# Patient Record
Sex: Female | Born: 1955 | Race: White | Hispanic: No | Marital: Married | Smoking: Former smoker
Health system: Southern US, Community
[De-identification: ages and names within clinical notes are randomized; demographics above are authoritative.]

## PROBLEM LIST (undated history)

## (undated) DIAGNOSIS — G8929 Other chronic pain: Secondary | ICD-10-CM

## (undated) DIAGNOSIS — M549 Dorsalgia, unspecified: Secondary | ICD-10-CM

## (undated) DIAGNOSIS — Q796 Ehlers-Danlos syndrome, unspecified: Secondary | ICD-10-CM

## (undated) HISTORY — PX: APPENDECTOMY: SHX54

## (undated) HISTORY — PX: MENISCUS REPAIR: SHX5179

## (undated) HISTORY — PX: JOINT REPLACEMENT: SHX530

## (undated) HISTORY — PX: NECK SURGERY: SHX720

## (undated) HISTORY — PX: SHOULDER SURGERY: SHX246

## (undated) HISTORY — PX: MASTECTOMY: SHX3

## (undated) HISTORY — PX: PARTIAL HYSTERECTOMY: SHX80

## (undated) HISTORY — PX: ANKLE SURGERY: SHX546

---

## 1999-03-09 ENCOUNTER — Emergency Department (HOSPITAL_COMMUNITY): Admission: EM | Admit: 1999-03-09 | Discharge: 1999-03-09 | Payer: Self-pay | Admitting: Emergency Medicine

## 1999-10-12 ENCOUNTER — Other Ambulatory Visit: Admission: RE | Admit: 1999-10-12 | Discharge: 1999-10-12 | Payer: Self-pay | Admitting: Internal Medicine

## 1999-10-25 ENCOUNTER — Encounter: Admission: RE | Admit: 1999-10-25 | Discharge: 1999-10-25 | Payer: Self-pay | Admitting: Internal Medicine

## 1999-10-25 ENCOUNTER — Encounter: Payer: Self-pay | Admitting: Internal Medicine

## 1999-10-31 ENCOUNTER — Encounter: Payer: Self-pay | Admitting: Internal Medicine

## 1999-10-31 ENCOUNTER — Encounter: Admission: RE | Admit: 1999-10-31 | Discharge: 1999-10-31 | Payer: Self-pay | Admitting: Internal Medicine

## 1999-11-09 ENCOUNTER — Encounter: Payer: Self-pay | Admitting: General Surgery

## 1999-11-09 ENCOUNTER — Encounter (INDEPENDENT_AMBULATORY_CARE_PROVIDER_SITE_OTHER): Payer: Self-pay | Admitting: Specialist

## 1999-11-09 ENCOUNTER — Ambulatory Visit (HOSPITAL_COMMUNITY): Admission: RE | Admit: 1999-11-09 | Discharge: 1999-11-09 | Payer: Self-pay | Admitting: General Surgery

## 2000-06-06 ENCOUNTER — Encounter: Admission: RE | Admit: 2000-06-06 | Discharge: 2000-06-06 | Payer: Self-pay | Admitting: Internal Medicine

## 2000-06-25 ENCOUNTER — Encounter: Admission: RE | Admit: 2000-06-25 | Discharge: 2000-06-25 | Payer: Self-pay | Admitting: Internal Medicine

## 2000-08-30 ENCOUNTER — Emergency Department (HOSPITAL_COMMUNITY): Admission: EM | Admit: 2000-08-30 | Discharge: 2000-08-30 | Payer: Self-pay | Admitting: *Deleted

## 2000-08-30 ENCOUNTER — Encounter: Payer: Self-pay | Admitting: *Deleted

## 2000-08-31 ENCOUNTER — Encounter: Payer: Self-pay | Admitting: Emergency Medicine

## 2000-10-03 ENCOUNTER — Encounter: Admission: RE | Admit: 2000-10-03 | Discharge: 2000-11-26 | Payer: Self-pay | Admitting: Surgical Oncology

## 2000-12-03 ENCOUNTER — Encounter: Payer: Self-pay | Admitting: Internal Medicine

## 2000-12-03 ENCOUNTER — Encounter: Admission: RE | Admit: 2000-12-03 | Discharge: 2000-12-03 | Payer: Self-pay | Admitting: Internal Medicine

## 2001-04-22 ENCOUNTER — Encounter: Payer: Self-pay | Admitting: Internal Medicine

## 2001-04-22 ENCOUNTER — Encounter: Admission: RE | Admit: 2001-04-22 | Discharge: 2001-04-22 | Payer: Self-pay | Admitting: Internal Medicine

## 2001-05-22 ENCOUNTER — Encounter: Admission: RE | Admit: 2001-05-22 | Discharge: 2001-05-22 | Payer: Self-pay | Admitting: Family Medicine

## 2001-05-22 ENCOUNTER — Encounter: Payer: Self-pay | Admitting: Family Medicine

## 2001-05-30 ENCOUNTER — Ambulatory Visit (HOSPITAL_COMMUNITY): Admission: RE | Admit: 2001-05-30 | Discharge: 2001-05-30 | Payer: Self-pay | Admitting: Neurological Surgery

## 2001-05-30 ENCOUNTER — Encounter: Payer: Self-pay | Admitting: Neurological Surgery

## 2001-06-10 ENCOUNTER — Encounter: Payer: Self-pay | Admitting: Family Medicine

## 2001-06-10 ENCOUNTER — Ambulatory Visit (HOSPITAL_COMMUNITY): Admission: RE | Admit: 2001-06-10 | Discharge: 2001-06-10 | Payer: Self-pay | Admitting: Family Medicine

## 2001-07-09 ENCOUNTER — Inpatient Hospital Stay (HOSPITAL_COMMUNITY): Admission: RE | Admit: 2001-07-09 | Discharge: 2001-07-14 | Payer: Self-pay | Admitting: Orthopedic Surgery

## 2001-07-10 ENCOUNTER — Encounter: Payer: Self-pay | Admitting: Orthopedic Surgery

## 2001-07-11 ENCOUNTER — Encounter: Payer: Self-pay | Admitting: Orthopedic Surgery

## 2001-08-03 ENCOUNTER — Emergency Department (HOSPITAL_COMMUNITY): Admission: EM | Admit: 2001-08-03 | Discharge: 2001-08-03 | Payer: Self-pay | Admitting: *Deleted

## 2002-01-13 ENCOUNTER — Encounter: Payer: Self-pay | Admitting: Neurological Surgery

## 2002-01-13 ENCOUNTER — Encounter: Admission: RE | Admit: 2002-01-13 | Discharge: 2002-01-13 | Payer: Self-pay | Admitting: Neurological Surgery

## 2002-02-19 ENCOUNTER — Encounter: Admission: RE | Admit: 2002-02-19 | Discharge: 2002-02-19 | Payer: Self-pay | Admitting: *Deleted

## 2002-02-19 ENCOUNTER — Encounter: Payer: Self-pay | Admitting: *Deleted

## 2002-06-24 ENCOUNTER — Encounter: Payer: Self-pay | Admitting: Orthopedic Surgery

## 2002-06-24 ENCOUNTER — Encounter: Admission: RE | Admit: 2002-06-24 | Discharge: 2002-06-24 | Payer: Self-pay | Admitting: Orthopedic Surgery

## 2002-08-03 ENCOUNTER — Encounter: Payer: Self-pay | Admitting: Pulmonary Disease

## 2002-08-03 ENCOUNTER — Ambulatory Visit (HOSPITAL_COMMUNITY): Admission: RE | Admit: 2002-08-03 | Discharge: 2002-08-03 | Payer: Self-pay | Admitting: Internal Medicine

## 2002-10-23 ENCOUNTER — Encounter: Payer: Self-pay | Admitting: Pulmonary Disease

## 2002-11-20 ENCOUNTER — Other Ambulatory Visit: Admission: RE | Admit: 2002-11-20 | Discharge: 2002-11-20 | Payer: Self-pay | Admitting: Internal Medicine

## 2002-12-07 ENCOUNTER — Encounter: Payer: Self-pay | Admitting: Pulmonary Disease

## 2003-01-08 ENCOUNTER — Emergency Department (HOSPITAL_COMMUNITY): Admission: EM | Admit: 2003-01-08 | Discharge: 2003-01-08 | Payer: Self-pay | Admitting: Emergency Medicine

## 2003-07-07 ENCOUNTER — Ambulatory Visit (HOSPITAL_COMMUNITY): Admission: RE | Admit: 2003-07-07 | Discharge: 2003-07-07 | Payer: Self-pay | Admitting: Gastroenterology

## 2003-07-07 ENCOUNTER — Encounter (INDEPENDENT_AMBULATORY_CARE_PROVIDER_SITE_OTHER): Payer: Self-pay | Admitting: Specialist

## 2003-07-16 ENCOUNTER — Encounter: Admission: RE | Admit: 2003-07-16 | Discharge: 2003-07-16 | Payer: Self-pay | Admitting: Internal Medicine

## 2004-06-21 ENCOUNTER — Emergency Department (HOSPITAL_COMMUNITY): Admission: EM | Admit: 2004-06-21 | Discharge: 2004-06-21 | Payer: Self-pay | Admitting: *Deleted

## 2004-11-10 ENCOUNTER — Ambulatory Visit: Payer: Self-pay | Admitting: Internal Medicine

## 2005-05-04 ENCOUNTER — Emergency Department (HOSPITAL_COMMUNITY): Admission: EM | Admit: 2005-05-04 | Discharge: 2005-05-04 | Payer: Self-pay | Admitting: Emergency Medicine

## 2005-05-31 ENCOUNTER — Other Ambulatory Visit: Admission: RE | Admit: 2005-05-31 | Discharge: 2005-05-31 | Payer: Self-pay | Admitting: Internal Medicine

## 2005-06-05 ENCOUNTER — Ambulatory Visit: Payer: Self-pay | Admitting: Internal Medicine

## 2005-11-11 ENCOUNTER — Encounter: Admission: RE | Admit: 2005-11-11 | Discharge: 2005-11-11 | Payer: Self-pay | Admitting: Anesthesiology

## 2008-04-07 ENCOUNTER — Inpatient Hospital Stay (HOSPITAL_COMMUNITY): Admission: RE | Admit: 2008-04-07 | Discharge: 2008-04-16 | Payer: Self-pay | Admitting: Orthopedic Surgery

## 2008-04-20 ENCOUNTER — Emergency Department (HOSPITAL_COMMUNITY): Admission: EM | Admit: 2008-04-20 | Discharge: 2008-04-21 | Payer: Self-pay | Admitting: Emergency Medicine

## 2009-01-11 ENCOUNTER — Ambulatory Visit: Payer: Self-pay | Admitting: Pulmonary Disease

## 2009-01-11 DIAGNOSIS — R0602 Shortness of breath: Secondary | ICD-10-CM | POA: Insufficient documentation

## 2009-01-11 DIAGNOSIS — E785 Hyperlipidemia, unspecified: Secondary | ICD-10-CM | POA: Insufficient documentation

## 2009-01-11 DIAGNOSIS — Q796 Ehlers-Danlos syndrome, unspecified: Secondary | ICD-10-CM | POA: Insufficient documentation

## 2009-01-11 DIAGNOSIS — J309 Allergic rhinitis, unspecified: Secondary | ICD-10-CM | POA: Insufficient documentation

## 2009-01-11 DIAGNOSIS — E782 Mixed hyperlipidemia: Secondary | ICD-10-CM | POA: Insufficient documentation

## 2009-01-11 DIAGNOSIS — F952 Tourette's disorder: Secondary | ICD-10-CM | POA: Insufficient documentation

## 2010-09-23 ENCOUNTER — Encounter: Payer: Self-pay | Admitting: Gastroenterology

## 2010-09-24 ENCOUNTER — Encounter: Payer: Self-pay | Admitting: *Deleted

## 2010-10-03 NOTE — Progress Notes (Signed)
Summary: Pulmonary [facility]   Imported By: Sherian Rein 01/11/2009 15:18:07  _____________________________________________________________________  External Attachment:    Type:   Image     Comment:   External Document

## 2010-10-03 NOTE — Assessment & Plan Note (Signed)
Summary: consult for dyspnea   Copy to:  Roselind Messier Primary Arrianna Catala/Referring Shereen Marton:  Roselind Messier  CC:  Pulmonary Consult.  History of Present Illness: The pt is a 55y/o female in who is referred for evaluation of dyspnea.  She has been seen in the past for a similiar complaint, and felt to have VCD.  Her pfts at the time were normal, and she was treated with a PPI bid for presumed LPR.  She did have significant improvement with this per Dr. Thurston Hole note.  The pt notes intermittant sob at rest that resolves with "raising my arms over my head".  She has doe as well, but is very debilitated from recent surgeries.  She has a dry cough intermittantly, but had one episode where she "coughed up a ball of yellow stuff".  Her recent cxr's have been totally clear.  She denies pleuritic chest pain, hemoptysis, LE edema, and calf tenderness.  Her sats are 97% on room air today.  She does note postnasal drip, and only has clear drainage from nares.  She denies reflux symptoms, and takes protonix daily.  Preventive Screening-Counseling & Management     Smoking Status: quit     Packs/Day: 1.0     Year Started: 1970     Year Quit: 2000  Current Medications (verified): 1)  Trazodone Hcl 150 Mg Tabs (Trazodone Hcl) .... Take 1 Tab By Mouth At Bedtime 2)  Dexedrine 10 Mg Xr24h-Cap (Dextroamphetamine Sulfate) .... Take 1 Tablet By Mouth Once A Day 3)  Lexapro 20 Mg Tabs (Escitalopram Oxalate) .... Take 1 Tablet By Mouth Once A Day 4)  Oxycontin 10 Mg Xr12h-Tab (Oxycodone Hcl) .... Take 1 Tablet By Mouth Two Times A Day 5)  Percocet .... As Needed For Breakthrough Pain 6)  Metanx 2.8-25-2 Mg Tabs (L-Methylfolate-B6-B12) .... Take 1 Tab By Mouth At Bedtime 7)  Klonopin 0.5 Mg Tabs (Clonazepam) .... Take 1 Tab By Mouth At Bedtime 8)  Lipitor 10 Mg Tabs (Atorvastatin Calcium) .... Take 1 Tablet By Mouth Once A Day  Allergies (verified): 1)  ! Sulfa 2)  ! * Nickle 3)  ! * Cobalt 4)  ! 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  Family History:    emphysema: paternal grandmother    heart disease: mother    clotting disorders: father (stroke)   Social History:    Patient states former smoker.  started at age 62.  1 ppd.  quit 2000.    pt is married with 2 children.    pt is not currently working.  Previously worked as a Museum/gallery conservator.      Smoking Status:  quit    Packs/Day:  1.0  Review of Systems       The patient complains of shortness of breath with activity, shortness of breath at rest, non-productive cough, loss of appetite, weight change, headaches, anxiety, depression, joint stiffness or pain, and change in color of mucus.  The patient denies productive cough, coughing up blood, chest pain, irregular heartbeats, acid heartburn, indigestion, abdominal pain, difficulty swallowing, sore throat, tooth/dental problems, nasal congestion/difficulty breathing through nose, sneezing, itching, ear ache, hand/feet swelling, rash, and fever.    Vital Signs:  Patient profile:   55  year old female Height:      65 inches Weight:      190.13 pounds BMI:     31.75 O2 Sat:      97 % Temp:     99.8 degrees F oral Pulse rate:   123 / minute BP sitting:   130 / 88  (left arm) Cuff size:   regular  Vitals Entered By: Arman Filter LPN (Jan 11, 2009 11:28 AM)  O2 Sat on room air at rest %:  97 CC: Pulmonary Consult Is Patient Diabetic? No Comments Medications reviewed with patient Arman Filter LPN  Jan 11, 2009 11:43 AM    Physical Exam  General:  obese female in nad Eyes:  PERRLA and EOMI.   Nose:  patent without discharge Mouth:  clear Neck:  soft and nontender, bs+ Lungs:  totally clear to auscultation Heart:  rrr, no mrg Abdomen:  soft and  nontender,bs+ Extremities:  minimal edema, no calf tenderness. Neurologic:  alert and oriented, moves all 4.   Pulmonary Function Test Date: 01/11/2009 Gender: Female  Pre-Spirometry FVC    Value: 3.43 L/min   Pred: 3.24 L/min     % Pred: 106 % FEV1    Value: 2.80 L     Pred: 2.65 L     % Pred: 105 % FEV1/FVC  Value: 82 %     Evaluation: normal  Impression & Recommendations:  Problem # 1:  DYSPNEA (ICD-786.05) there is nothing by the patient's history, exam, or objective testing, to suggest a pulmonary etiology for any of her symptoms. 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 Given that her symptoms are similar, I think we should treat her more aggressively for reflux and see if things improve. I see no reason to do further pulmonary workup at this time. I've also asked her to work on weight loss and some type of conditioning program.  Medications Added to Medication List This Visit: 1)  Trazodone Hcl 150 Mg Tabs (Trazodone hcl) .... Take 1 tab by mouth at bedtime 2)  Dexedrine 10 Mg Xr24h-cap (Dextroamphetamine sulfate) .... Take 1 tablet by mouth once a day 3)  Lexapro 20 Mg Tabs (Escitalopram oxalate) .... Take 1 tablet by mouth once a day 4)  Oxycontin 10 Mg Xr12h-tab (Oxycodone hcl) .... Take 1 tablet by mouth two times a day 5)  Percocet  .... As needed for breakthrough pain 6)  Metanx 2.8-25-2 Mg Tabs (L-methylfolate-b6-b12) .... Take 1 tab by mouth at bedtime 7)  Klonopin 0.5 Mg Tabs (Clonazepam) .... Take 1 tab by mouth at bedtime 8)  Lipitor 10 Mg Tabs (Atorvastatin calcium) .... Take 1 tablet by mouth once a day 9)  Protonix 40 Mg Tbec (Pantoprazole sodium) .... One am and pm before meals  Other Orders: Consultation Level IV (04540) Spirometry w/Graph (98119)  Patient Instructions: 1)  stay on protonix and increase dose to am and pm for 4 weeks to see if symptoms improve. If  not, then go back to once a day.  If improves, you need to discuss maintenance dose with your primary md 2)  work on weight loss and conditioning. 3)  no further pulmonary follow up needed.  Prescriptions: PROTONIX 40 MG  TBEC (PANTOPRAZOLE SODIUM) one am and pm before meals  #60 x 0   Entered and Authorized by:   Barbaraann Share MD   Signed by:   Barbaraann Share  MD on 01/11/2009   Method used:   Print then Give to Patient   RxID:   [id number]

## 2010-10-03 NOTE — Progress Notes (Signed)
Summary: Pulmonary Office Note/[facility]  Pulmonary Office Note/[facility]   Imported By: Sherian Rein 01/11/2009 15:23:53  _____________________________________________________________________  External Attachment:    Type:   Image     Comment:   External Document

## 2011-01-16 NOTE — Op Note (Signed)
[name], [name]              ACCOUNT NO.:  [id number]   MEDICAL RECORD NO.:  [id number]          PATIENT TYPE:  INP   LOCATION:  5013                         FACILITY:  [facility]   PHYSICIAN:  Loreta Ave, M.D. DATE OF BIRTH:  04/30/1956   DATE OF PROCEDURE:  04/07/2008  DATE OF DISCHARGE:                               OPERATIVE REPORT   PREOPERATIVE DIAGNOSIS:  Right hip end-stage degenerative arthritis.   POSTOPERATIVE DIAGNOSIS:  Right hip end-stage degenerative arthritis.   PROCEDURE:  Right total hip replacement utilizing Stryker prosthesis.  Press-fit 50-mm acetabular component screw fixation x2.  A 32-mm  internal-diameter 10-degree overhang polyethylene liner.  A press-fit  Accolade #2 femoral component, 127-degree neck angle.  A 32-mm +0 Bio-  Lok head.   SURGEON:  Loreta Ave, MD   ASSISTANT:  Genene Churn. Denton Meek.  Presence throughout the entire case  was necessary for timely completion of the procedure.   ANESTHESIA:  General.   BLOOD LOSS:  500 mL.   BLOOD GIVEN:  None.   SPECIMENS:  None.   CULTURES:  None.   COMPLICATIONS:  None.   DRESSINGS:  Soft compressive with abduction pillow.   DESCRIPTION OF PROCEDURE:  The patient was brought to the operating room  and placed on the operating table in supine position.  After adequate  anesthesia had been obtained turned to a lateral position, right side  up.  Prepped and draped in the usual sterile fashion.  Incision along  the shaft of the femur extending posterosuperior.  Skin and subcutaneous  tissue divided.  The iliotibial band exposed, incised.  Charnley  retractor put in place.  External rotator and capsule taken down off the  back of the intertrochanteric groove of the femur.  Neurovascular  structures identified and protected throughout.  External rotator and  capsule tagged with FiberWire.  Hip dislocated posteriorly.  __________.  Femoral neck at one fingerbreadth above the lesser  trochanter.  Femoral  head removed.  Acetabulum exposed.  Soft tissue debrided, redundant  labrum all removed.  Debridement down to good bleeding bone __________.  The patient __________, sized for a 50 mm component.  This was hammered  in place, set at 45 degrees of abduction, 20 degrees of anteversion.  __________ augmented with two screws through the cup pre-drilled.  A 10-  degree overhang 32-mm internal-diameter polyethylene liner placed with  the overhang placed superoposterior.  Pleased with alignment and  fixation.  Attention turned to femur.  This was reamed out to a #2  component.  After appropriate trials, a #2 component was seated,  restoring normal anteversion.  With a 32 +0 Bio-Lok head attached, the  hip was reduced.  Excellent restoration of leg lengths.  Good stability  in flexion and extension.  Wound irrigated.  External rotator and  capsule were repaired to the back of the intertrochanteric groove and  FiberWire tied over the bony bridge.  Charnley retractor removed.  Wound  irrigated.  The iliotibial band closed with #1 Vicryl, skin and  subcutaneous tissue with Vicryl and staples.  Washed the wound, injected  Marcaine.  Returned to supine position.  Abduction pillow placed.  Anesthesia reversed.  Brought to Recovery Room.  Tolerated surgery well.  No complications.      Loreta Ave, M.D.  Electronically Signed     DFM/MEDQ  D:  04/07/2008  T:  04/08/2008  Job:  [id number]

## 2011-01-16 NOTE — Op Note (Signed)
NAMESKYLEEN, [name]              ACCOUNT NO.:  [id number]   MEDICAL RECORD NO.:  [id number]          PATIENT TYPE:  INP   LOCATION:  5013                         FACILITY:  [facility]   PHYSICIAN:  Loreta Ave, M.D. DATE OF BIRTH:  1955/12/29   DATE OF PROCEDURE:  DATE OF DISCHARGE:                               OPERATIVE REPORT   PREOPERATIVE DIAGNOSIS:  Status post right total hip replacement with  early shifting, loosening, and change of position of acetabular  component to an acceptable position.   POSTOPERATIVE DIAGNOSES:  Status post right total hip replacement with  early shifting, loosening, and change of position of acetabular  component to an acceptable position with also loosening of femoral  component very early on postop and an under appreciation of her degree  of regional osteopenia.  Also postoperative hematoma.   PROCEDURES:  Exploration of right hip with drainage of superficial and  deep hematoma.  Revision of all components with removal of femoral  component in its entirety.  Removal of acetabular polyethylene and  metallic components.  Revision to a slightly larger acetabular component  of 54 mm.  Screw fixation x2.  A 36-mm internal diameter 10-degree  overhang polyethylene insert.  Revision of femoral component to a Secur-  Fit Plus femoral component size #7 with 11 mm distal stem.  A Bio-Lock  36-mm ceramic head -2.5 mm.   SURGEON:  Loreta Ave, MD   ASSISTANT:  Genene Churn. Barry Dienes, [place], present throughout the entire case and  necessary for timely completion of the procedure.   ANESTHESIA:  General.   ESTIMATED BLOOD LOSS:  300 mL.   BLOOD GIVEN:  None.   SPECIMENS:  None.   CULTURES:  None.   COMPLICATIONS:  None.   DRESSING:  Soft compressive.   PROCEDURE:  The patient was brought to the operating room, placed on the  operating table in supine position.  After adequate anesthesia had been  obtained, turned to a lateral position.  The right  hip was examined.  Despite the vertical shifting of the acetabular component, her hips  still remained exceptionally stable.  Prepped and draped in usual  sterile fashion.  The previous staples removed.  Wound was opened.  A  moderate amount of superficial hematoma debrided.  No evidence of  infection.  Iliotibial band opened and deep hematoma cleared out as  well.  External rotator capsule taken back down off the back of the  intertrochanteric groove.  FiberWire removed and re-tagged with new  FiberWire.  The hip exposed.  Dislocated posteriorly, which was actually  fairly difficult to do.  I then attempted to remove the head from the  femoral component but in doing this, the entire femoral component came  out of the shaft.  This was despite the fact that it was securely  implanted and by x-ray appeared to be a good size prosthesis and  certainly did not appear to be undersized.  Her degree of regional  osteopenia was much more than I had appreciated at the time of her  initial intervention.  Given the fact that the  Accolade component was  that loose and that easily removed, I elected to revise this to a  different shape femoral component.  Attention was first turned to the  acetabulum.  The polyethylene insert was removed by placing a drill hole  and then a screw in this and the screw used to pop the polyethylene out.  There was obvious motion to an extent of the metallic portion and it had  shifted to a much more vertical position.  Able to remove the screws and  remove the component.  Again a little bit more osteopenia that I had  appreciated previously.  I brought the acetabulum up to bleeding bone  once again and removed all soft tissue debris.  I was finally able to  get good stability, seating, and fixation utilizing a 54-mm component.  I utilized two intraoperative x-rays to be satisfied that this fit well,  was well captured in good position and then two screws replaced through   the cup to get this firmly anchored.  I was pleased with stability and  alignment.  I utilized the 10-degree overhang 36-mm internal diameter  insert with the overhang placed posterosuperiorly.  Thorough irrigation  prior to placement of component.  I then prepared the femur with  proximal and distal reaming for the Secur-Fit Plus component.  This was  finally fixed and seated with a #7 component with 11-mm distal stem.  After appropriate trials, the -2.5 mm x 36 mm Bio-Lock head was attached  and the hip reduced.  Excellent equal leg lengths.  Excellent stability  in flexion and extension.  I then got a final x-ray just to be sure all  components were fixed in good position and there was nothing adverse.  Once this was confirmed, the external rotator and capsule repair of the  back in intertrochanteric groove through drill holes.  Sutures tied over  bony bridge.  Wound irrigated.  Iliotibial band closed with Vicryl.  Subcutaneous tissue closed with Vicryl.  Skin closed with nylon.  Because of issues of skin irritation from tape, we used a large clear  dressing over top of this avoiding tape.  Returned to supine position.  Anesthesia reversed.  Brought to recovery room.  Tolerated the surgery  well.  No complications.      Loreta Ave, M.D.  Electronically Signed     DFM/MEDQ  D:  04/15/2008  T:  04/15/2008  Job:  [id number]

## 2011-01-19 NOTE — Op Note (Signed)
   [name], [name]                        ACCOUNT NO.:  [id number]   MEDICAL RECORD NO.:  [id number]                   PATIENT TYPE:  AMB   LOCATION:  ENDO                                 FACILITY:  [facility]   PHYSICIAN:  John C. Madilyn Fireman, M.D.                 DATE OF BIRTH:  October 19, 1955   DATE OF PROCEDURE:  07/07/2003  DATE OF DISCHARGE:                                 OPERATIVE REPORT   PROCEDURE:  Esophagogastroduodenoscopy with biopsy.   INDICATION FOR PROCEDURE:  Patient with dyspepsia, early satiety, nausea,  and constipation who is also undergoing screening colonoscopy.   DESCRIPTION OF PROCEDURE:  The patient was placed in the left lateral  decubitus position and placed on the pulse monitor with continuous low-flow  oxygen delivered by nasal cannula.  She was sedated with 62.5 mcg IV  fentanyl and 5 mg IV Versed.  The Olympus video endoscope was advanced under  direct vision into the oropharynx and esophagus.  The esophagus was straight  and of normal caliber with the squamocolumnar line at 38 cm.  There was no  visible hiatal hernia, ring, stricture, or other abnormality of the GE  junction.  The stomach was entered, and a small amount of liquid secretions  were suctioned from the fundus.  Retroflexed view of the cardia was  unremarkable.  The fundus, body, antrum, and pylorus all appeared normal.  The duodenum was entered, and both the bulb and second portion were well-  inspected and appeared to be within normal limits.  The scope was then  withdrawn back into the stomach, and there were some streaks of erythema  noted, and a CLOtest was obtained.  The scope was then withdrawn and the  patient returned to the recovery room in stable condition.  She tolerated  the procedure well, and there were no immediate complications.   IMPRESSION:  1. Mild antral gastritis.  2. Otherwise normal sturdy.   PLAN:  Await CLOtest and proceed to colonoscopy as planned.                                     John C. Madilyn Fireman, M.D.    JCH/MEDQ  D:  07/07/2003  T:  07/07/2003  Job:  [id number]   cc:   Lilla Shook, M.D.  [address], [address]  [place]  [place]  [address]  Fax: [phone]   Charlaine Dalton. Sherene Sires, M.D. [facility]

## 2011-01-19 NOTE — Op Note (Signed)
   NAME:  [name], [name]                        ACCOUNT NO.:  [id number]   MEDICAL RECORD NO.:  [id number]                   PATIENT TYPE:  EMS   LOCATION:  MAJO                                 FACILITY:  [facility]   PHYSICIAN:  John C. Madilyn Fireman, M.D.                 DATE OF BIRTH:  11/26/1980   DATE OF PROCEDURE:  07/07/2003  DATE OF DISCHARGE:  05/29/2003                                 OPERATIVE REPORT   PROCEDURE:  Colonoscopy.   INDICATIONS FOR PROCEDURE:  Colon cancer screening in a 55 year old patient  with a personal history of breast cancer who is also undergoing an EGD today  for dyspeptic symptoms.   DESCRIPTION OF PROCEDURE:  The patient was placed in the left lateral  decubitus position and placed on the pulse monitor with continuous low-flow  oxygen delivered by nasal cannula.  She was sedated with 37.5 mcg IV  fentanyl and 5 mg IV Versed in addition to the medicine given for the  previous EGD.  The Olympus video colonoscope was inserted into the rectum  and advanced to the cecum, confirmed by transillumination of McBurney's  point and visualization of the ileocecal valve and appendiceal orifice.  Prep was excellent.  The cecum, ascending, transverse, descending, and  sigmoid colon all appeared normal with no masses, polyps, diverticula, or  other mucosal abnormalities.  In the rectum, at approximately 15 cm, there  was a small 6-7 mm polyp that was fulgurated by hot biopsy.  The remainder  of the rectum appeared normal down to the anus where there were some  slightly enlarged internal hemorrhoids seen.  The scope was the withdrawn.  The patient returned to the recovery room in stable condition.  She  tolerated the procedure well and there were no immediate complications.   IMPRESSION:  1. Small rectal polyp.  2. Small internal hemorrhoids.   PLAN:  Await histology to determine the method and interval for future colon  screening.             John C. Madilyn Fireman, M.D.    JCH/MEDQ  D:  07/07/2003  T:  07/07/2003  Job:  [id number]   cc:   Lilla Shook, M.D.  [address], [address]  [place]  [place]  [address]  Fax: [phone]   Charlaine Dalton. Sherene Sires, M.D. [facility]

## 2011-01-19 NOTE — Discharge Summary (Signed)
[name], [name]              ACCOUNT NO.:  [id number]   MEDICAL RECORD NO.:  [id number]          PATIENT TYPE:  INP   LOCATION:  5013                         FACILITY:  [facility]   PHYSICIAN:  Loreta Ave, M.D. DATE OF BIRTH:  [birth year]   DATE OF ADMISSION:  04/07/2008  DATE OF DISCHARGE:  04/16/2008                               DISCHARGE SUMMARY   FINAL DIAGNOSES:  1. 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  2. Postoperative blood loss anemia.  3. Ehlers-Danlos syndrome.  4. Asthma.  5. Depression.  6. Hyperlipidemia.  7. Chronic pain management.   HISTORY OF PRESENT ILLNESS:  A 55 year old white female with history of  end-stage DJD, right hip and chronic pain presented to our office for  preop evaluation for a total hip replacement.  She had progressively  worsening pain with failed response to conservative treatment.  Significant decrease in her daily activities due to the ongoing  complaint.   HOSPITAL COURSE:  On April 07, 2008, the patient was taken to the [facility] OR and a right total hip replacement procedure performed.  Surgeon,  Loreta Ave, MD and assistant Genene Churn. Owens, PA-C; anesthesia,  general; EBL 500 mL; no drain used.  There were no surgical or  anesthesia complications, and the patient was transferred to recovery in  stable condition.  On April 08, 2008, the patient was doing well with  good pain control.  Temp 99.8, pulse 91, respirations 19, and blood  pressure 107/56.  Hematocrit 24.2, hemoglobin 8.1, and preop hemoglobin  was 14.7.  INR 1.2.  Dressing clean, dry, and intact.  Calf nontender.  Neurovascular intact.  Transfused 2 units of packed red blood cells due  to acute blood loss anemia.  Apparently that morning, Dr. Eulah Pont had  reviewed postop right hip x-ray, which showed that the acetabular  component had migrated to 70 degrees vertical position.  He spoke with  [name] and advised that this is not acceptable position for a good  long-term outcome for surgery; advised that best option at this point  would be taking her back to the OR for a total hip revision.  Procedure  discussed with the patient and her husband, and this to be planned for  April 12, 2008.  Stopped Coumadin and heparin protocol.  Started  Lovenox 40 mg 1 subcu injection daily.  On April 09, 2008, the patient  doing well with good pain control.  No complaints.  Temp 99.1, pulse 88,  respirations 22, and blood pressure 96/50.  Hemoglobin 9.4 and  hematocrit 27.6.  Sodium 139, potassium 4.3, chloride 106, CO2 27, BUN  5, creatinine 0.64, glucose 119, and INR 3.1.  Wound looks good.  Staples intact.  No drainage or signs of infection.  Calf nontender.  Neurovascular intact.  Discontinued PCA.  On April 10, 2008, the patient  doing well.  She has some concern about blister formation from  the  bandage.  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  She said she is  allergic to multiple adhesives.  Temp 99.7, pulse 79, respirations 18,  and blood pressure 123/57.  Hemoglobin 9.0 and hematocrit 26.9.  INR  2.4.  Wound looks good.  Staples intact.  There is minimal serous  drainage.  No signs of infection.  There are few small blisters around  the dressing site.  On April 11, 2008, the patient doing well.  No new  complaints.  Pain controlled.  Temp 100.4, pulse 76, respirations 18,  and blood pressure 97/63.  Hemoglobin 9.0 and hematocrit 26.8.  Electrolytes stable.  Wound looks good and no change in blisters.  Minimal serous drainage.  No signs of infection.  Calf nontender.  Neurovascularly intact.  Hold Lovenox dose this evening and plan for OR  in the a.m.  On April 12, 2008, the patient doing well with good pain  control.  She is requesting a copy of operative note for her records.  Temp 97.4, pulse 70,  respirations 20, and blood pressure 103/63.  Hemoglobin 9.3 and hematocrit 28.1.  INR 1.4.  Wound looks okay.  Staples intact.  She had moderate serous drainage on dressing.  No signs  of infection.  Neurovascularly intact.  Calf nontender.  At 1645 hours,  April 12, 2008, the patient was taken to the [facility] OR, and a right  total hip revision procedure performed, surgeon Loreta Ave, M.D.  and assistant Genene Churn. Owens, PA-C; anesthesia general; EBL 300 mL; no  drain used.  There were no surgical or anesthesia complications, and the  patient was transferred to recovery room in stable condition.  On April 13, 2008, the patient complained of right hip and right knee pain.  Previous history of chronic pain management and followed by Dr.  Vear Clock.  She requested that he see her in the hospital and she stated  that she had already called his office requesting a consult.  Right knee  pain since surgery yesterday.  Temp 100.6, pulse 83, respirations 22,  and blood pressure 99/62.  WBC 12.7, hematocrit 31.2, hemoglobin 10.7,  and platelets 277.  Sodium 133, potassium 4.0, chloride 100, CO2 28, BUN  6, creatinine 0.59, and glucose 151.  INR 1.5.  Dressing clean, dry, and  intact.  Right knee diffusely tender.  Calf nontender.  Neurovascularly  intact.  I had discussed with the patient typical protocol for postop  pain management after hip replacement/revision.  She did become very  upset and stated that she did not want me to see her in the hospital  during her admission.  I advised her that Dr. Vear Clock could be  consulted.  Dr. Eulah Pont advised of the situation.  Nurse from the unit  was present during the entire conversation with the patient.  We ordered  an x-ray, right knee.  Pharmacy-protocol Coumadin was started.  On  April 14, 2008, the patient awake and alert.  Comfortable.  Wound  looked good.  Sutures intact.  Discontinued Foley.  Weaned p.o.  medications.  The patient seen  by Dr. Eulah Pont.  Right knee x-ray, no  acute or significant findings.  On April 15, 2008, the patient was  doing well without complaints.  Pain under control with PCA.  Vital  signs stable, afebrile.  Hemoglobin 8.6 and hematocrit 25.6.  INR 2.5.  Wound looked good.  Staples intact.  No signs of infection.  Calf  nontender.  Neurovascularly  intact.  Changed medications to OxyContin 20  mg p.o. b.i.d., Percocet 10/325, 1-2 tabs p.o. q.4-6 h. p.r.n. for pain,  and Robaxin 500 mg 1 tab p.o. q.6 h. p.r.n. for spasms.  She is  progressing well with therapy.  On April 16, 2008, the patient doing  well, says that she is ready to go home.  Vital signs stable, afebrile.  INR 1.9.  Wound looks good.  There is a small amount of serous drainage.  No signs of infection.  Calf nontender.  Neurovascularly intact.   DISPOSITION:  Discharged to home.   CONDITION:  Stable.   MEDICATIONS:  1. Percocet 10/325, 1-2 tabs p.o. q.4-6 h. for pain.  2. Coumadin, pharmacy protocol.  3. Robaxin 500 mg 1 tab p.o. q.6 h. p.r.n. for spasms.  4. Resume previous home medications.   INSTRUCTIONS:  She will work with home health, PT, and OT to improve  ambulation.  She is at touchdown weightbearing.  Daily dressing changes  with 4 x 4s and tape of her choice.  Coumadin x4 weeks postop for DVT  prophylaxis.  She is okay to shower but no tub soaking.  Follow up when  she is 2 weeks postoperative for recheck.  Return sooner if needed.     Genene Churn. Denton Meek.      Loreta Ave, M.D.  Electronically Signed   [id number]/[id number]  D:  06/23/2008  T:  06/24/2008  Job:  [id number]

## 2011-01-19 NOTE — Op Note (Signed)
[place]  Patient:    [name], [name]                     MRN: [id number] Proc. Date: 11/09/99 Adm. Date:  [id number] Attending:  Tempie Donning CC:         Modesta Messing, M.D.                           Operative Report  PREOPERATIVE DIAGNOSIS:  Abnormal mammogram, right breast.  POSTOPERATIVE DIAGNOSIS:  Abnormal mammogram, right breast, pending pathology.  OPERATIVE PROCEDURE:  Excisional biopsy, right breast mass - abnormality on mammogram.  SURGEON:  Gita Kudo, M.D.  ANESTHESIA:  Intravenous sedation, 1% Xylocaine.  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  Recent mammogram showed new calcifications, and are suspicious enough to warrant biopsy.  OPERATIVE FINDINGS:  The area was well-localized by radiology.  The mammogram of the specimen showed that the calcifications had been removed.  DESCRIPTION OF PROCEDURE:  Under satisfactory intravenous sedation, the patients right breast was prepped and draped in a standard fashion.  One percent Xylocaine was infiltrated throughout for good analgesia.  A crescent shape incision was made around the wire, and then cautery and scalpel were used to remove a large portion of tissue widely around and deep to the wire.  I did not encounter the wire, and therefore, felt I had removed the calcifications.  The wound was made hemostatic by cautery and lavaged with saline.  The biopsy site was marked with several metal  clips and then the breast reconstructed with interrupted 3-0 Vicryl, and the skin edges approximated with 4-0 nylon.  After the report came back from x-ray, a sterile absorbent compressive dressing was applied, and the patient went to the recovery room from the operating room in good condition. DD:  11/09/99 TD:  11/10/99 Job: [id number]

## 2011-01-19 NOTE — Op Note (Signed)
[name]  Patient:    [name], [name] Visit Number: [id number] MRN: [id number]          Service Type: MED Location: [id number] Attending Physician:  Colbert Ewing Dictated by:   Loreta Ave, M.D. Proc. Date: 07/09/01 Admit Date:  07/09/2001                             Operative Report  PREOPERATIVE DIAGNOSIS:  Degenerative arthritis, left hip.  POSTOPERATIVE DIAGNOSIS:  Degenerative arthritis, left hip.  OPERATIVE PROCEDURE:  Left total hip replacement, [employer]on[employer] prosthesis, press fit 50 mm acetabular component, screw fixation x 2, 10 degree polyethylene insert, press fit femoral component size 8 with 12 mm distal stem Eon plus type, 0 head.  SURGEON;  Loreta Ave, M.D.  ASSISTANT:  Arlys John D. Petrarca, P.A.-C.  ANESTHESIA:  General.  ESTIMATED BLOOD LOSS:  250 cc.  BLOOD GIVEN:  None.  SPECIMENS EXCISED:  Bone and soft tissue.  CULTURES:  None.  COMPLICATIONS:  None.  DRESSING:  Soft compressive with abduction pillow.  DESCRIPTION OF PROCEDURE:  The patient was brought to the operating room and placed on the operating room table in supine position.  After adequate anesthesia had been obtained, turned to a lateral position left side up. Prepped and draped in the usual sterile fashion.  Lateral incision up the shaft and the femur extending posterior superior.  Skin and subcutaneous tissue divided.  The iliotibial band exposed.  Hemostasis with cautery.  The iliotibial band incised.  ______ retractor put in place.  Neurovascular structures identified and protected.  ______ and capsule taken down on the back of the intertrochanteric groove on the femur, and tagged with #1 Ethibond.  Leg lengths were assessed with mild shortening on the left.  The hip exposed posteriorly.  Hip dislocated posteriorly.  Femoral head had grade III approaching grade IV changes.  The femoral head was cut off in alignment with the  definitive prosthesis one fingerbreadth above the lesser trochanter. Acetabulum exposed.  She did have a degenerative tearing labrum which was all excised.  Some grade III changes in the acetabulum, very thin remaining articular cartilage.  Articular spurs, synovitis, and ligamentum all debrided from the acetabulum.  Sequential reaming up to good bleeding bone throughout. Sized for a 50 mm component.  This was able to be pressed put with excellent catching and excision.  Placed in 45 degrees of abduction and 20 of anteversion.  Fixation augmented with a 20 mm screw above, and a 16 mm screw posterior superior placed through the screw holes in the acetabulum, and firmly screwed down after pre-drilling.  Polyethylene was placed in the cup with a 10 degree overhand placed posterior superior.  Femur exposed. Sequential reaming with the hand held and the power reamers both proximally and distally up to good sizing throughout the femur proximal and distal from a #8 component with a 12 mm distal stem.  Trial reduction.  With a 127 degree neck angle and a 0 head, I had excellent restoration of leg lengths with good stability in flexion and extension and good motion.  Trials were removed.  The femur was copiously irrigated.  A #8 component was hammered down into place with excellent capturing fixation and restoration of normal femoral inversion met.  The 0 head attached.  Hip reduced and checked for stability and alignment, and was excellent.  Wound irrigated.  External rotators and  capsule repaired to the back of the intertrochanteric groove with the #1 sutures passed through drill holes and over tied over a blunted bridge.  Wound irrigated.  Neurovascular structures identified, intact, and protected. ______ retractor removed.  Iliotibial band closed with #1 Vicryl.  Skin and subcutaneous tissue with Vicryl and staples.  Margins of the wound injected with Marcaine.  Sterile compressive dressing  applied.  Returned to supine position.  Abduction pillow placed.  Anesthesia reversed.  Brought to the recovery room.  Tolerated surgery well with no complications. Dictated by:   Loreta Ave, M.D. Attending Physician:  Colbert Ewing DD:  07/09/01 TD:  07/11/01 Job: [id number]

## 2011-01-19 NOTE — Discharge Summary (Signed)
[place]. [facility]  Patient:    [name], PATANE Visit Number: [id number] MRN: [id number]          Service Type: EMS Location: [facility] Attending Physician:  Carmelina Peal Dictated by:   Oris Drone Petrarca, P.A.-C. Admit Date:  08/03/2001 Discharge Date: 08/03/2001                             Discharge Summary  ADMISSION DIAGNOSIS:  Degenerative joint disease of the left hip.  DISCHARGE DIAGNOSIS:  Degenerative joint disease of the left hip.  PROCEDURE:  Left total hip replacement.  HISTORY OF PRESENT ILLNESS:  This is a 55 year old white female with advancing DJD of the left hip.  She has had increasing pain in the left groin with difficulty with ambulation.  She has lost about 50% joint space in the last year and a half.  Now indicated for left total hip replacement.  HOSPITAL COURSE:  A 55 year old female admitted July 09, 2001.  After appropriate laboratory studies as well as 1 g of vancomycin IV call to the operating room, she was taken to the operating room where she underwent a left total hip replacement.  She tolerated the procedure well. She was continued postoperatively on vancomycin 1 g IV q.12h. x3 doses.  She was begun on heparin 5000 units subcu q.12h. until her Coumadin became therapeutic.  She was allowed out of bed the following day.  Physical therapy and occupational therapy consults were given.  Physical therapy for ambulation, touchdown weightbearing only on the left.  PCA pump was used for postop pain management. Her dressings were changed postop day #2.  She was then transferred over to 5000 when a bed was available.  Her temperature was 102.0 on postop day #2 and she was noted to be mildly hypokalemic. This was treated.  The remainder of her hospital course was uneventful and she was discharged on July 14, 2001, to return back to our office in 10 days for staple removal.  EKG was noted to have sinus bradycardia,  otherwise normal.  LABORATORY STUDIES:  July 08, 2001, hemoglobin 14.0, hematocrit 40.7%, white count 10,200, platelets 269,000.  Discharge  hemoglobin 8.8, hematocrit 25.6%.  Chemistries of July 08, 2001, showed a sodium of 136, potassium 4.0, chloride 102, CO2 26, glucose 98, BUN 13, creatinine 0.7, calcium 9.4, total protein 7.5, albumin 3.9, AST 20, ALT 24, ALP 69, total bilirubin 0.7. Discharge sodium 141, potassium 3.6, chloride 103, CO2 33, glucose 108, BUN 5, creatinine 0.7, calcium 8.6.  Urinalysis showed 15 ketones, urobilinogen 0.2, otherwise benign.  Blood type was O positive, antibody screen negative.  DISCHARGE INSTRUCTIONS:  She was given a prescription for iron sulfate 325 mg one p.o. daily. Coumadin 5 mg as directed by [facility].  Percocet 5/325 one to two q.4h. p.r.n. pain.  She will remain touchdown to nonweightbearing on the total hip side.  She will change her dressing as needed. She may shower on a daily basis.  No restrictions with her diet.  Call if she has swelling, redness, malodorous or temperature greater than 101 degrees. She will follow back up in the office in 10 days for staple removal.  She was discharge in improved condition. Dictated by:   Oris Drone Petrarca, P.A.-C. Attending Physician:  Carmelina Peal DD:  09/08/01 TD:  09/08/01 Job: [id number]

## 2011-01-19 NOTE — Op Note (Signed)
[facility]  Patient:    [name], [name]                     MRN: [id number] Proc. Date: 11/09/99 Adm. Date:  [id number] Disc. Date: [id number] Attending:  Tempie Donning CC:         Modesta Messing, M.D.                           Operative Report  PREOPERATIVE DIAGNOSIS:  Abnormal mammogram, right breast.  POSTOPERATIVE DIAGNOSIS:  Abnormal mammogram, right breast, pending pathology.  OPERATIVE PROCEDURE:  Excisional biopsy, right breast mass - abnormality on mammogram.  SURGEON:  Gita Kudo, M.D.  ANESTHESIA:  Intravenous sedation, 1% Xylocaine.  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  Recent mammogram showed new calcifications, and are suspicious enough to warrant biopsy.  OPERATIVE FINDINGS:  The area was well-localized by radiology.  The mammogram of the specimen showed that the calcifications had been removed.  DESCRIPTION OF PROCEDURE:  Under satisfactory intravenous sedation, the patients right breast was prepped and draped in a standard fashion.  One percent Xylocaine was infiltrated throughout for good analgesia.  A crescent shape incision was made around the wire, and then cautery and scalpel were used to remove a large portion of tissue widely around and deep to the wire.  I did not encounter the wire, and therefore, felt I had removed the calcifications.  The wound was made hemostatic by cautery and lavaged with saline.  The biopsy site was marked with several metal  clips and then the breast reconstructed with interrupted 3-0 Vicryl, and the skin edges approximated with 4-0 nylon.  After the report came back from x-ray, a sterile absorbent compressive dressing was applied, and the patient went to the recovery room from the operating room in good condition. DD:  11/09/99 TD:  11/10/99 Job: [id number]

## 2011-04-05 DIAGNOSIS — F411 Generalized anxiety disorder: Secondary | ICD-10-CM | POA: Insufficient documentation

## 2011-04-05 DIAGNOSIS — R030 Elevated blood-pressure reading, without diagnosis of hypertension: Secondary | ICD-10-CM | POA: Insufficient documentation

## 2011-04-05 DIAGNOSIS — Z86000 Personal history of in-situ neoplasm of breast: Secondary | ICD-10-CM | POA: Insufficient documentation

## 2011-06-01 LAB — CBC
HCT: 43.8
HCT: 24.2 — ABNORMAL LOW
HCT: 25.6 — ABNORMAL LOW
HCT: 26.8 — ABNORMAL LOW
HCT: 26.9 — ABNORMAL LOW
HCT: 27.6 — ABNORMAL LOW
HCT: 28.1 — ABNORMAL LOW
HCT: 31.2 — ABNORMAL LOW
HCT: 31.2 — ABNORMAL LOW
Hemoglobin: 14.7
Hemoglobin: 10.6 — ABNORMAL LOW
Hemoglobin: 10.7 — ABNORMAL LOW
Hemoglobin: 8.1 — ABNORMAL LOW
Hemoglobin: 8.6 — ABNORMAL LOW
Hemoglobin: 9 — ABNORMAL LOW
Hemoglobin: 9 — ABNORMAL LOW
Hemoglobin: 9.3 — ABNORMAL LOW
Hemoglobin: 9.4 — ABNORMAL LOW
MCHC: 33.6
MCHC: 33.2
MCHC: 33.4
MCHC: 33.6
MCHC: 33.7
MCHC: 33.7
MCHC: 33.8
MCHC: 34.1
MCHC: 34.3
MCV: 91
MCV: 89.6
MCV: 89.7
MCV: 89.7
MCV: 90.7
MCV: 90.7
MCV: 91.3
MCV: 91.4
MCV: 91.9
Platelets: 239
Platelets: 137 — ABNORMAL LOW
Platelets: 149 — ABNORMAL LOW
Platelets: 164
Platelets: 200
Platelets: 258
Platelets: 277
Platelets: 312
Platelets: 325
RBC: 4.81
RBC: 2.63 — ABNORMAL LOW
RBC: 2.8 — ABNORMAL LOW
RBC: 2.96 — ABNORMAL LOW
RBC: 2.99 — ABNORMAL LOW
RBC: 3.08 — ABNORMAL LOW
RBC: 3.1 — ABNORMAL LOW
RBC: 3.42 — ABNORMAL LOW
RBC: 3.48 — ABNORMAL LOW
RDW: 13.6
RDW: 13.3
RDW: 13.7
RDW: 13.8
RDW: 13.9
RDW: 13.9
RDW: 14
RDW: 14.1
RDW: 14.3
WBC: 9.6
WBC: 12.7 — ABNORMAL HIGH
WBC: 14.3 — ABNORMAL HIGH
WBC: 14.5 — ABNORMAL HIGH
WBC: 7.2
WBC: 8.5
WBC: 8.9
WBC: 9.2
WBC: 9.5

## 2011-06-01 LAB — URINALYSIS, ROUTINE W REFLEX MICROSCOPIC
Bilirubin Urine: NEGATIVE
Glucose, UA: NEGATIVE
Ketones, ur: NEGATIVE
Leukocytes, UA: NEGATIVE
Nitrite: NEGATIVE
Protein, ur: NEGATIVE
Specific Gravity, Urine: 1.028
Urobilinogen, UA: 0.2
pH: 5.5

## 2011-06-01 LAB — BASIC METABOLIC PANEL
BUN: 10
BUN: 5 — ABNORMAL LOW
BUN: 5 — ABNORMAL LOW
BUN: 5 — ABNORMAL LOW
BUN: 5 — ABNORMAL LOW
BUN: 6
BUN: 7
CO2: 27
CO2: 27
CO2: 27
CO2: 28
CO2: 30
CO2: 32
CO2: 34 — ABNORMAL HIGH
Calcium: 7.6 — ABNORMAL LOW
Calcium: 7.7 — ABNORMAL LOW
Calcium: 7.7 — ABNORMAL LOW
Calcium: 7.8 — ABNORMAL LOW
Calcium: 7.9 — ABNORMAL LOW
Calcium: 8.1 — ABNORMAL LOW
Calcium: 8.4
Chloride: 100
Chloride: 100
Chloride: 102
Chloride: 103
Chloride: 104
Chloride: 106
Chloride: 99
Creatinine, Ser: 0.59
Creatinine, Ser: 0.59
Creatinine, Ser: 0.64
Creatinine, Ser: 0.66
Creatinine, Ser: 0.67
Creatinine, Ser: 0.69
Creatinine, Ser: 0.76
GFR calc Af Amer: >60
GFR calc Af Amer: >60
GFR calc Af Amer: >60
GFR calc Af Amer: >60
GFR calc Af Amer: >60
GFR calc Af Amer: >60
GFR calc Af Amer: >60
GFR calc non Af Amer: >60
GFR calc non Af Amer: >60
GFR calc non Af Amer: >60
GFR calc non Af Amer: >60
GFR calc non Af Amer: >60
GFR calc non Af Amer: >60
GFR calc non Af Amer: >60
Glucose, Bld: 105 — ABNORMAL HIGH
Glucose, Bld: 107 — ABNORMAL HIGH
Glucose, Bld: 111 — ABNORMAL HIGH
Glucose, Bld: 119 — ABNORMAL HIGH
Glucose, Bld: 133 — ABNORMAL HIGH
Glucose, Bld: 136 — ABNORMAL HIGH
Glucose, Bld: 151 — ABNORMAL HIGH
Potassium: 3.6
Potassium: 3.9
Potassium: 4
Potassium: 4
Potassium: 4.3
Potassium: 4.4
Potassium: 4.5
Sodium: 133 — ABNORMAL LOW
Sodium: 133 — ABNORMAL LOW
Sodium: 136
Sodium: 138
Sodium: 139
Sodium: 140
Sodium: 142

## 2011-06-01 LAB — TYPE AND SCREEN
ABO/RH(D): O POS
ABO/RH(D): O POS
Antibody Screen: NEGATIVE
Antibody Screen: NEGATIVE

## 2011-06-01 LAB — PROTIME-INR
INR: 0.9
INR: 1.2
INR: 1.4
INR: 1.5
INR: 1.7 — ABNORMAL HIGH
INR: 1.9 — ABNORMAL HIGH
INR: 2.4 — ABNORMAL HIGH
INR: 2.5 — ABNORMAL HIGH
INR: 2.7 — ABNORMAL HIGH
INR: 3.1 — ABNORMAL HIGH
Prothrombin Time: 12.4
Prothrombin Time: 15.5 — ABNORMAL HIGH
Prothrombin Time: 18.2 — ABNORMAL HIGH
Prothrombin Time: 19.1 — ABNORMAL HIGH
Prothrombin Time: 21.2 — ABNORMAL HIGH
Prothrombin Time: 23.1 — ABNORMAL HIGH
Prothrombin Time: 27.9 — ABNORMAL HIGH
Prothrombin Time: 28.3 — ABNORMAL HIGH
Prothrombin Time: 30.9 — ABNORMAL HIGH
Prothrombin Time: 34.6 — ABNORMAL HIGH

## 2011-06-01 LAB — COMPREHENSIVE METABOLIC PANEL
ALT: 36 — ABNORMAL HIGH
ALT: 25
AST: 29
AST: 24
Albumin: 4.1
Albumin: 2.2 — ABNORMAL LOW
Alkaline Phosphatase: 76
Alkaline Phosphatase: 58
BUN: 18
BUN: 5 — ABNORMAL LOW
CO2: 25
CO2: 33 — ABNORMAL HIGH
Calcium: 9.4
Calcium: 8.2 — ABNORMAL LOW
Chloride: 106
Chloride: 103
Creatinine, Ser: 0.75
Creatinine, Ser: 0.69
GFR calc Af Amer: >60
GFR calc Af Amer: >60
GFR calc non Af Amer: >60
GFR calc non Af Amer: >60
Glucose, Bld: 111 — ABNORMAL HIGH
Glucose, Bld: 104 — ABNORMAL HIGH
Potassium: 4.3
Potassium: 4.6
Sodium: 138
Sodium: 142
Total Bilirubin: 0.5
Total Bilirubin: 0.5
Total Protein: 7.2
Total Protein: 4.8 — ABNORMAL LOW

## 2011-06-01 LAB — URINE MICROSCOPIC-ADD ON

## 2011-06-01 LAB — POCT I-STAT 4, (NA,K, GLUC, HGB,HCT)
Glucose, Bld: 109 — ABNORMAL HIGH
HCT: 29 — ABNORMAL LOW
Hemoglobin: 9.9 — ABNORMAL LOW
Potassium: 3.8
Sodium: 137

## 2011-06-01 LAB — PREPARE RBC (CROSSMATCH)

## 2011-06-01 LAB — APTT
aPTT: 34
aPTT: 65 — ABNORMAL HIGH

## 2011-06-01 LAB — ABO/RH: ABO/RH(D): O POS

## 2011-06-01 LAB — HEMOGLOBIN AND HEMATOCRIT, BLOOD
HCT: 34.9 — ABNORMAL LOW
Hemoglobin: 11.7 — ABNORMAL LOW

## 2012-01-13 DIAGNOSIS — F988 Other specified behavioral and emotional disorders with onset usually occurring in childhood and adolescence: Secondary | ICD-10-CM | POA: Insufficient documentation

## 2012-11-11 ENCOUNTER — Emergency Department (HOSPITAL_COMMUNITY)
Admission: EM | Admit: 2012-11-11 | Discharge: 2012-11-11 | Disposition: A | Discharge status: Home / Self Care | Payer: Medicare Other | Attending: Emergency Medicine | Admitting: Emergency Medicine

## 2012-11-11 ENCOUNTER — Encounter (HOSPITAL_COMMUNITY): Payer: Self-pay | Admitting: Emergency Medicine

## 2012-11-11 DIAGNOSIS — R5383 Other fatigue: Secondary | ICD-10-CM | POA: Insufficient documentation

## 2012-11-11 DIAGNOSIS — R1013 Epigastric pain: Secondary | ICD-10-CM | POA: Insufficient documentation

## 2012-11-11 DIAGNOSIS — G8929 Other chronic pain: Secondary | ICD-10-CM | POA: Insufficient documentation

## 2012-11-11 DIAGNOSIS — R197 Diarrhea, unspecified: Secondary | ICD-10-CM | POA: Insufficient documentation

## 2012-11-11 DIAGNOSIS — Z79899 Other long term (current) drug therapy: Secondary | ICD-10-CM | POA: Insufficient documentation

## 2012-11-11 DIAGNOSIS — Z8739 Personal history of other diseases of the musculoskeletal system and connective tissue: Secondary | ICD-10-CM | POA: Insufficient documentation

## 2012-11-11 DIAGNOSIS — R5381 Other malaise: Secondary | ICD-10-CM | POA: Insufficient documentation

## 2012-11-11 DIAGNOSIS — Z90710 Acquired absence of both cervix and uterus: Secondary | ICD-10-CM | POA: Insufficient documentation

## 2012-11-11 DIAGNOSIS — R112 Nausea with vomiting, unspecified: Secondary | ICD-10-CM | POA: Insufficient documentation

## 2012-11-11 HISTORY — DX: Ehlers-Danlos syndrome, unspecified: Q79.60

## 2012-11-11 HISTORY — DX: Dorsalgia, unspecified: M54.9

## 2012-11-11 HISTORY — DX: Other chronic pain: G89.29

## 2012-11-11 LAB — URINE MICROSCOPIC-ADD ON

## 2012-11-11 LAB — CBC WITH DIFFERENTIAL/PLATELET
Basophils Absolute: 0 10*3/uL (ref 0.0–0.1)
Basophils Relative: 0 % (ref 0–1)
Eosinophils Absolute: 0.1 10*3/uL (ref 0.0–0.7)
Eosinophils Relative: 1 % (ref 0–5)
HCT: 49.3 % — ABNORMAL HIGH (ref 36.0–46.0)
Hemoglobin: 16.9 g/dL — ABNORMAL HIGH (ref 12.0–15.0)
Lymphocytes Relative: 9 % — ABNORMAL LOW (ref 12–46)
Lymphs Abs: 1.4 10*3/uL (ref 0.7–4.0)
MCH: 29.8 pg (ref 26.0–34.0)
MCHC: 34.3 g/dL (ref 30.0–36.0)
MCV: 86.8 fL (ref 78.0–100.0)
Monocytes Absolute: 1.1 10*3/uL — ABNORMAL HIGH (ref 0.1–1.0)
Monocytes Relative: 8 % (ref 3–12)
Neutro Abs: 12.1 10*3/uL — ABNORMAL HIGH (ref 1.7–7.7)
Neutrophils Relative %: 82 % — ABNORMAL HIGH (ref 43–77)
Platelets: 289 10*3/uL (ref 150–400)
RBC: 5.68 MIL/uL — ABNORMAL HIGH (ref 3.87–5.11)
RDW: 13.9 % (ref 11.5–15.5)
WBC: 14.7 10*3/uL — ABNORMAL HIGH (ref 4.0–10.5)

## 2012-11-11 LAB — URINALYSIS, ROUTINE W REFLEX MICROSCOPIC
Bilirubin Urine: NEGATIVE
Glucose, UA: NEGATIVE mg/dL
Ketones, ur: NEGATIVE mg/dL
Nitrite: NEGATIVE
Protein, ur: NEGATIVE mg/dL
Specific Gravity, Urine: 1.018 (ref 1.005–1.030)
Urobilinogen, UA: 0.2 mg/dL (ref 0.0–1.0)
pH: 5.5 (ref 5.0–8.0)

## 2012-11-11 LAB — COMPREHENSIVE METABOLIC PANEL
ALT: 19 U/L (ref 0–35)
AST: 19 U/L (ref 0–37)
Albumin: 3.9 g/dL (ref 3.5–5.2)
Alkaline Phosphatase: 94 U/L (ref 39–117)
BUN: 16 mg/dL (ref 6–23)
CO2: 24 mEq/L (ref 19–32)
Calcium: 9.5 mg/dL (ref 8.4–10.5)
Chloride: 100 mEq/L (ref 96–112)
Creatinine, Ser: 0.76 mg/dL (ref 0.50–1.10)
GFR calc Af Amer: >90 mL/min (ref >90)
GFR calc non Af Amer: >90 mL/min (ref >90)
Glucose, Bld: 153 mg/dL — ABNORMAL HIGH (ref 70–99)
Potassium: 4.1 mEq/L (ref 3.5–5.1)
Sodium: 138 mEq/L (ref 135–145)
Total Bilirubin: 0.3 mg/dL (ref 0.3–1.2)
Total Protein: 8.6 g/dL — ABNORMAL HIGH (ref 6.0–8.3)

## 2012-11-11 LAB — LIPASE, BLOOD: Lipase: 16 U/L (ref 11–59)

## 2012-11-11 MED ORDER — SODIUM CHLORIDE 0.9 % IV BOLUS (SEPSIS)
1000.0000 mL | Freq: Once | INTRAVENOUS | Status: AC
  Administered 2012-11-11: 1000 mL via INTRAVENOUS

## 2012-11-11 MED ORDER — SODIUM CHLORIDE 0.9 % IV SOLN
Freq: Once | INTRAVENOUS | Status: AC
  Administered 2012-11-11: 16:00:00 via INTRAVENOUS

## 2012-11-11 MED ORDER — ONDANSETRON 8 MG PO TBDP: 8.0000 mg | ORAL_TABLET | Freq: Three times a day (TID) | ORAL | Status: DC | PRN

## 2012-11-11 NOTE — ED Notes (Signed)
Pt drinking ginger ale and states that it is staying down okay. Pt was able to ambulate to and from wheelchair to go to bathroom well. Did state that she had some diarrhea.

## 2012-11-11 NOTE — ED Notes (Signed)
Pt c/o NVD since about 0800 this morning.  Also c/o back pain which is chronic.  States she has been around someone who has had norovirus 2 days ago.

## 2012-11-11 NOTE — ED Notes (Signed)
Pt reports abd pain, back pain with n/v/d since 0800 this am.  Pt is also reporting chills.  Pt reports vomiting x 8-10 and diarrhea x 3 today.  Pt reports being around someone who was sick Sunday.  No vomiting or diarrhea at present.

## 2012-11-11 NOTE — ED Provider Notes (Signed)
History     CSN: [id number]  Arrival date & time 11/11/12  1257   First MD Initiated Contact with Patient 11/11/12 1432      Chief Complaint  Patient presents with  . Nausea  . Emesis  . Diarrhea    (Consider location/radiation/quality/duration/timing/severity/associated sxs/prior treatment) HPI Comments: Patient with history of Ehlers-Danlos syndrome, history of appendectomy and hysterectomy -- presents with a six-hour history of nausea, vomiting, and soft stools. Vomiting is nonbloody and nonbilious. Diarrhea is nonbloody. Upper abdominal cramping. Patient took her usual chronic pain medication. Patient states that she felt extremely weak today. H/o N/V/D several weeks ago that resolved in 2 days. The onset of this condition was gradual. The course is constant. Aggravating factors: none. Alleviating factors: none.    Patient is a 57 y.o. female presenting with vomiting and diarrhea. The history is provided by the patient.  Emesis Associated symptoms: abdominal pain (upper abd cramping) and diarrhea   Associated symptoms: no headaches, no myalgias and no sore throat   Diarrhea Associated symptoms: abdominal pain (upper abd cramping) and vomiting   Associated symptoms: no fever, no headaches and no myalgias     Past Medical History  Diagnosis Date  . Chronic back pain   . EDS (Ehlers-Danlos syndrome)     Past Surgical History  Procedure Laterality Date  . Mastectomy    . Neck surgery    . Joint replacement      2 shoulders, 4 hips    History reviewed. No pertinent family history.  History  Substance Use Topics  . Smoking status: Never Smoker   . Smokeless tobacco: Not on file  . Alcohol Use: No    OB History   Grav Para Term Preterm Abortions TAB SAB Ect Mult Living                  Review of Systems  Constitutional: Negative for fever.  HENT: Negative for sore throat and rhinorrhea.   Eyes: Negative for redness.  Respiratory: Negative for cough.    Cardiovascular: Negative for chest pain.  Gastrointestinal: Positive for nausea, vomiting, abdominal pain (upper abd cramping) and diarrhea.  Genitourinary: Negative for dysuria.  Musculoskeletal: Negative for myalgias.  Skin: Negative for rash.  Neurological: Negative for headaches.    Allergies  Pneumococcal vaccines and Sulfonamide derivatives  Home Medications   Current Outpatient Rx  Name  Route  Sig  Dispense  Refill  . Cholecalciferol (VITAMIN D) 2000 UNITS tablet   Oral   Take 2,000 Units by mouth daily.         Marland Kitchen doxepin (SINEQUAN) 25 MG capsule   Oral   Take 25-50 mg by mouth 2 (two) times daily.         . hydrOXYzine (ATARAX/VISTARIL) 25 MG tablet   Oral   Take 25 mg by mouth 4 (four) times daily.         . nitroGLYCERIN (NITROSTAT) 0.4 MG SL tablet   Sublingual   Place 0.4 mg under the tongue every 5 (five) minutes as needed for chest pain.         . OxyCODONE (OXYCONTIN) 15 mg T12A   Oral   Take 15 mg by mouth every 12 (twelve) hours.         . terbinafine (LAMISIL) 250 MG tablet   Oral   Take 250 mg by mouth daily.         . traZODone (DESYREL) 100 MG tablet   Oral   Take  200 mg by mouth at bedtime.         . Vitamin D, Ergocalciferol, (DRISDOL) 50000 UNITS CAPS   Oral   Take 50,000 Units by mouth every 7 (seven) days.            BP 129/76  Pulse 102  Temp(Src) 98.2 F (36.8 C) (Oral)  Resp 18  SpO2 99%  Physical Exam  Nursing note and vitals reviewed. Constitutional: She appears well-developed and well-nourished.  HENT:  Head: Normocephalic and atraumatic.  Mouth/Throat: Oropharynx is clear and moist. Mucous membranes are dry.  Eyes: Conjunctivae are normal. Right eye exhibits no discharge. Left eye exhibits no discharge.  Neck: Normal range of motion. Neck supple.  Cardiovascular: Normal rate, regular rhythm and normal heart sounds.   Pulmonary/Chest: Effort normal and breath sounds normal.  Abdominal: Soft. Bowel  sounds are normal. There is tenderness in the epigastric area. There is no rigidity, no rebound, no guarding, no CVA tenderness, no tenderness at McBurney's point and negative Murphy's sign.  Neurological: She is alert.  Skin: Skin is warm and dry.  Psychiatric: She has a normal mood and affect.    ED Course  Procedures (including critical care time)  Labs Reviewed  CBC WITH DIFFERENTIAL - Abnormal; Notable for the following:    WBC 14.7 (*)    RBC 5.68 (*)    Hemoglobin 16.9 (*)    HCT 49.3 (*)    Neutrophils Relative 82 (*)    Neutro Abs 12.1 (*)    Lymphocytes Relative 9 (*)    Monocytes Absolute 1.1 (*)    All other components within normal limits  COMPREHENSIVE METABOLIC PANEL - Abnormal; Notable for the following:    Glucose, Bld 153 (*)    Total Protein 8.6 (*)    All other components within normal limits  URINALYSIS, ROUTINE W REFLEX MICROSCOPIC - Abnormal; Notable for the following:    Hgb urine dipstick TRACE (*)    Leukocytes, UA TRACE (*)    All other components within normal limits  LIPASE, BLOOD  URINE MICROSCOPIC-ADD ON   No results found.   1. Nausea vomiting and diarrhea     2:44 PM Patient seen and examined. Work-up initiated. Medications ordered.   Vital signs reviewed and are as follows: Filed Vitals:   11/11/12 1303  BP: 129/76  Pulse: 102  Temp: 98.2 F (36.8 C)  Resp: 18   Pt informed of results. Additional, now watery stools, in ED. States she is feeling better however. She had tolerated PO fluids well.   The patient was urged to return to the Emergency Department immediately with worsening of current symptoms, worsening abdominal pain, persistent vomiting, blood noted in stools, fever, or any other concerns. The patient verbalized understanding.     MDM  Patient with symptoms consistent with viral gastroenteritis.  Vitals are stable, no fever.  No signs of dehydration, tolerating PO's.  Lungs are clear.  No focal abdominal pain, no  concern for appendicitis (appendectomy), cholecystitis, pancreatitis, ruptured viscus, UTI, kidney stone, or any other abdominal etiology.  Supportive therapy indicated with return if symptoms worsen.  Patient counseled.         Renne Crigler, PA-C 11/12/12 0006

## 2012-11-12 NOTE — ED Provider Notes (Signed)
Medical screening examination/treatment/procedure(s) were performed by non-physician practitioner and as supervising physician I was immediately available for consultation/collaboration.   Keyshia Orwick, MD 11/12/12 1324 

## 2014-03-25 ENCOUNTER — Other Ambulatory Visit: Payer: Self-pay | Admitting: Anesthesiology

## 2014-03-25 DIAGNOSIS — M542 Cervicalgia: Secondary | ICD-10-CM

## 2014-03-26 ENCOUNTER — Ambulatory Visit
Admission: RE | Admit: 2014-03-26 | Discharge: 2014-03-26 | Disposition: A | Discharge status: Home / Self Care | Payer: Medicare Other | Source: Ambulatory Visit | Attending: Anesthesiology | Admitting: Anesthesiology

## 2014-03-26 DIAGNOSIS — M542 Cervicalgia: Secondary | ICD-10-CM

## 2014-03-26 MED ORDER — GADOBENATE DIMEGLUMINE 529 MG/ML IV SOLN
18.0000 mL | Freq: Once | INTRAVENOUS | Status: AC | PRN
  Administered 2014-03-26: 18 mL via INTRAVENOUS

## 2018-06-18 ENCOUNTER — Emergency Department (HOSPITAL_COMMUNITY)
Admission: EM | Admit: 2018-06-18 | Discharge: 2018-06-18 | Disposition: A | Discharge status: Home / Self Care | Payer: Medicare Other | Attending: Emergency Medicine | Admitting: Emergency Medicine

## 2018-06-18 ENCOUNTER — Other Ambulatory Visit: Payer: Self-pay

## 2018-06-18 ENCOUNTER — Encounter (HOSPITAL_COMMUNITY): Payer: Self-pay

## 2018-06-18 ENCOUNTER — Emergency Department (HOSPITAL_COMMUNITY): Payer: Medicare Other

## 2018-06-18 DIAGNOSIS — R109 Unspecified abdominal pain: Secondary | ICD-10-CM | POA: Diagnosis present

## 2018-06-18 DIAGNOSIS — R319 Hematuria, unspecified: Secondary | ICD-10-CM | POA: Diagnosis not present

## 2018-06-18 DIAGNOSIS — Z96619 Presence of unspecified artificial shoulder joint: Secondary | ICD-10-CM | POA: Diagnosis not present

## 2018-06-18 DIAGNOSIS — M549 Dorsalgia, unspecified: Secondary | ICD-10-CM | POA: Diagnosis not present

## 2018-06-18 DIAGNOSIS — Z79899 Other long term (current) drug therapy: Secondary | ICD-10-CM | POA: Insufficient documentation

## 2018-06-18 DIAGNOSIS — Q796 Ehlers-Danlos syndrome, unspecified: Secondary | ICD-10-CM | POA: Insufficient documentation

## 2018-06-18 DIAGNOSIS — R609 Edema, unspecified: Secondary | ICD-10-CM

## 2018-06-18 DIAGNOSIS — Z96643 Presence of artificial hip joint, bilateral: Secondary | ICD-10-CM | POA: Insufficient documentation

## 2018-06-18 LAB — CBC WITH DIFFERENTIAL/PLATELET
Abs Immature Granulocytes: 0.05 10*3/uL (ref 0.00–0.07)
Basophils Absolute: 0.1 10*3/uL (ref 0.0–0.1)
Basophils Relative: 1 %
Eosinophils Absolute: 0.3 10*3/uL (ref 0.0–0.5)
Eosinophils Relative: 3 %
HCT: 43.9 % (ref 36.0–46.0)
Hemoglobin: 14 g/dL (ref 12.0–15.0)
Immature Granulocytes: 0 %
Lymphocytes Relative: 39 %
Lymphs Abs: 4.3 10*3/uL — ABNORMAL HIGH (ref 0.7–4.0)
MCH: 29 pg (ref 26.0–34.0)
MCHC: 31.9 g/dL (ref 30.0–36.0)
MCV: 90.9 fL (ref 80.0–100.0)
Monocytes Absolute: 0.6 10*3/uL (ref 0.1–1.0)
Monocytes Relative: 5 %
Neutro Abs: 5.9 10*3/uL (ref 1.7–7.7)
Neutrophils Relative %: 52 %
Platelets: 336 10*3/uL (ref 150–400)
RBC: 4.83 MIL/uL (ref 3.87–5.11)
RDW: 13.9 % (ref 11.5–15.5)
WBC: 11.2 10*3/uL — ABNORMAL HIGH (ref 4.0–10.5)
nRBC: 0 % (ref 0.0–0.2)

## 2018-06-18 LAB — URINALYSIS, ROUTINE W REFLEX MICROSCOPIC
Bacteria, UA: NONE SEEN
Bilirubin Urine: NEGATIVE
Glucose, UA: NEGATIVE mg/dL
Ketones, ur: NEGATIVE mg/dL
Nitrite: NEGATIVE
Protein, ur: NEGATIVE mg/dL
Specific Gravity, Urine: 1.021 (ref 1.005–1.030)
pH: 5 (ref 5.0–8.0)

## 2018-06-18 LAB — BASIC METABOLIC PANEL
Anion gap: 10 (ref 5–15)
BUN: 13 mg/dL (ref 8–23)
CO2: 29 mmol/L (ref 22–32)
Calcium: 9.6 mg/dL (ref 8.9–10.3)
Chloride: 104 mmol/L (ref 98–111)
Creatinine, Ser: 0.71 mg/dL (ref 0.44–1.00)
GFR calc Af Amer: >60 mL/min (ref >60)
GFR calc non Af Amer: >60 mL/min (ref >60)
Glucose, Bld: 136 mg/dL — ABNORMAL HIGH (ref 70–99)
Potassium: 4 mmol/L (ref 3.5–5.1)
Sodium: 143 mmol/L (ref 135–145)

## 2018-06-18 LAB — D-DIMER, QUANTITATIVE: D-Dimer, Quant: 1.08 ug/mL-FEU — ABNORMAL HIGH (ref 0.00–0.50)

## 2018-06-18 MED ORDER — SODIUM CHLORIDE 0.9 % IJ SOLN
INTRAMUSCULAR | Status: DC
Start: 2018-06-18 — End: 2018-06-19
  Filled 2018-06-18: qty 50

## 2018-06-18 MED ORDER — IOPAMIDOL (ISOVUE-370) INJECTION 76%
INTRAVENOUS | Status: AC
  Filled 2018-06-18: qty 100

## 2018-06-18 MED ORDER — IOPAMIDOL (ISOVUE-370) INJECTION 76%
100.0000 mL | Freq: Once | INTRAVENOUS | Status: AC | PRN
  Administered 2018-06-18: 100 mL via INTRAVENOUS

## 2018-06-18 NOTE — ED Triage Notes (Signed)
Patient arrives with c/o left sided flank pain that started 5 days ago. Patient denies N/V, denies fevers or urinary complaints. Patient also reports right leg swelling that started Saturday, patient states she has vascular issues to right leg due to multiple hip surgeries but swelling is still present.

## 2018-06-18 NOTE — ED Notes (Signed)
Grey tube sent to lab with urine cup.

## 2018-06-18 NOTE — Discharge Instructions (Addendum)
The cause of your symptoms was not identified today. Please present to [facility] for an ultrasound of your leg tomorrow. You can take Tylenol or ibuprofen, available over-the-counter according to label instructions for pain. Your urinalysis today had some blood in it, this is similar to prior studies. You had a CT scan of your abdomen today that showed a small, 2.3 cm cyst on your ovary. Please follow-up with your family doctor for recheck.

## 2018-06-18 NOTE — ED Provider Notes (Signed)
[facility]-EMERGENCY DEPT Provider Note   CSN: [id number] Arrival date & time: 06/18/18  1638     History   Chief Complaint Chief Complaint  Patient presents with  . Flank Pain  . Leg Swelling    HPI JAKHIA BUXTON is a 62 y.o. female.  The history is provided by the patient and the spouse. No language interpreter was used.  Flank Pain    TALINE NASS is a 62 y.o. female who presents to the Emergency Department complaining of back pain. She presents to the emergency department for evaluation of one week of left flank pain. Symptoms started after she went camping. When she arrived home she noted pain in the left mid back, worse in the mornings. Her symptoms seem to improve after her first void of the day. She felt feverish the first day but did not check her temperature at that time. She denies any chest pain, shortness of breath, abdominal pain, nausea, vomiting, dysuria. She does report increased swelling in her right leg for a the last week. She has a history of recurrent orthopedic surgeries, bilateral hip replacements. She has a history of hematuria in the past.  Records reviewed in care everywhere.   Past Medical History:  Diagnosis Date  . Chronic back pain   . EDS (Ehlers-Danlos syndrome)     Patient Active Problem List   Diagnosis Date Noted  . HYPERLIPIDEMIA 01/11/2009  . 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 ALLERGIC RHINITIS 01/11/2009  . EHLERS-DANLOS SYNDROME 01/11/2009  . DYSPNEA 01/11/2009    Past Surgical History:  Procedure Laterality Date  . JOINT REPLACEMENT     2 shoulders, 4 hips  . MASTECTOMY    . NECK SURGERY       OB History   None      Home Medications    Prior to Admission medications   Medication Sig Start Date End Date Taking? Authorizing Provider  cetirizine (ZYRTEC) 5 MG tablet Take 5 mg by mouth 2 (two) times daily.   Yes [provider]  Cholecalciferol (VITAMIN D) 2000 UNITS tablet Take 2,000  Units by mouth 3 (three) times a week.    Yes [provider]  dextroamphetamine (DEXTROSTAT) 10 MG tablet Take 5 mg by mouth 2 (two) times daily with breakfast and lunch.  05/15/18  Yes [provider]  nitroGLYCERIN (NITROSTAT) 0.4 MG SL tablet Place 0.4 mg under the tongue every 5 (five) minutes as needed for chest pain.   Yes [provider]  OxyCODONE (OXYCONTIN) 15 mg T12A Take 15 mg by mouth every 12 (twelve) hours.   Yes [provider]  trazodone (DESYREL) 300 MG tablet Take 300 mg by mouth at bedtime. 03/17/18  Yes [provider]  ondansetron (ZOFRAN ODT) 8 MG disintegrating tablet Take 1 tablet (8 mg total) by mouth every 8 (eight) hours as needed for nausea. Patient not taking: Reported on 06/18/2018 11/11/12   Renne Crigler, PA-C    Family History No family history on file.  Social History Social History   Tobacco Use  . Smoking status: Never Smoker  Substance Use Topics  . Alcohol use: No  . Drug use: No     Allergies   Escitalopram oxalate; Gluten meal; Platinum-containing compounds; Cobalt; Nickel; Tape; Bacitracin; Gold; Neomycin; Pneumococcal vaccines; and Sulfonamide derivatives   Review of Systems Review of Systems  Genitourinary: Positive for flank pain.  All other systems reviewed and are negative.    Physical Exam Updated Vital  Signs BP 136/72 (BP Location: Right Arm)   Pulse (!) 59   Temp 98.6 F (37 C) (Oral)   Resp 18   Ht 5\' 5"  (1.651 m)   Wt 71.2 kg   SpO2 97%   BMI 26.13 kg/m   Physical Exam  Constitutional: She is oriented to person, place, and time. She appears well-developed and well-nourished.  HENT:  Head: Normocephalic and atraumatic.  Cardiovascular: Normal rate and regular rhythm.  No murmur heard. Pulmonary/Chest: Effort normal and breath sounds normal. No respiratory distress.  Abdominal: Soft. There is no tenderness. There is no rebound and no guarding.  Left CVA tenderness to  palpation.  Musculoskeletal: She exhibits no tenderness.  No midline thoracic or lumbar tenderness to palpation. 2+ DP pulses bilaterally. Trace edema to RLE.    Neurological: She is alert and oriented to person, place, and time.  Skin: Skin is warm and dry.  Psychiatric: She has a normal mood and affect. Her behavior is normal.  Nursing note and vitals reviewed.    ED Treatments / Results  Labs (all labs ordered are listed, but only abnormal results are displayed) Labs Reviewed  URINALYSIS, ROUTINE W REFLEX MICROSCOPIC - Abnormal; Notable for the following components:      Result Value   Hgb urine dipstick MODERATE (*)    Leukocytes, UA TRACE (*)    All other components within normal limits  BASIC METABOLIC PANEL - Abnormal; Notable for the following components:   Glucose, Bld 136 (*)    All other components within normal limits  CBC WITH DIFFERENTIAL/PLATELET - Abnormal; Notable for the following components:   WBC 11.2 (*)    Lymphs Abs 4.3 (*)    All other components within normal limits  D-DIMER, QUANTITATIVE (NOT AT [facility]) - Abnormal; Notable for the following components:   D-Dimer, Quant 1.08 (*)    All other components within normal limits    EKG None  Radiology Ct Angio Chest Pe W/cm &/or Wo Cm  Result Date: 06/18/2018 CLINICAL DATA:  Left flank pain starting 5 days ago. Right leg swelling since Saturday. EXAM: CT ANGIOGRAPHY CHEST CT ABDOMEN AND PELVIS WITH CONTRAST TECHNIQUE: Multidetector CT imaging of the chest was performed using the standard protocol during bolus administration of intravenous contrast. Multiplanar CT image reconstructions and MIPs were obtained to evaluate the vascular anatomy. Multidetector CT imaging of the abdomen and pelvis was performed using the standard protocol during bolus administration of intravenous contrast. CONTRAST:  ISOVUE-370 IOPAMIDOL (ISOVUE-370) INJECTION 76% COMPARISON:  None. FINDINGS: CTA CHEST FINDINGS Cardiovascular:  Motion artifact limits examination. There is good opacification of the central and segmental pulmonary arteries. No focal filling defects are demonstrated. No evidence of significant pulmonary embolus. Normal caliber thoracic aorta. Great vessel origins are patent. Normal heart size. No pericardial effusions. Mediastinum/Nodes: No enlarged mediastinal, hilar, or axillary lymph nodes. Thyroid gland, trachea, and esophagus demonstrate no significant findings. Lungs/Pleura: Motion artifact limits examination. No airspace disease or consolidation is suggested. Air cyst in the right costophrenic angle. Tiny right pleural effusion. No pneumothorax. Airways are patent. Musculoskeletal: Degenerative changes in the spine. Normal alignment. Mild superior endplate compression at T8. Short-segment scoliosis in the upper thoracic spine convex towards the left and centered at T3-4. Review of the MIP images confirms the above findings. CT ABDOMEN and PELVIS FINDINGS Hepatobiliary: No focal liver abnormality is seen. No gallstones, gallbladder wall thickening, or biliary dilatation. Pancreas: Unremarkable. No pancreatic ductal dilatation or surrounding inflammatory changes. Spleen: Normal in size  without focal abnormality. Adrenals/Urinary Tract: Adrenal glands are unremarkable. Kidneys are normal, without renal calculi, focal lesion, or hydronephrosis. Bladder is mostly obscured by streak artifact from hip arthroplasties. Stomach/Bowel: Stomach is within normal limits. Appendix appears normal. No evidence of bowel wall thickening, distention, or inflammatory changes. Vascular/Lymphatic: No significant vascular findings are present. No enlarged abdominal or pelvic lymph nodes. Reproductive: Pelvis is mostly obscured by streak artifact. Ovaries are not enlarged. Small cyst on the right ovary measuring 2.3 cm diameter. Other: No free air or free fluid in the abdomen. Abdominal wall musculature appears intact. Musculoskeletal:  Bilateral total hip arthroplasties. No acute bony abnormalities identified. Review of the MIP images confirms the above findings. IMPRESSION: 1. No evidence of significant pulmonary embolus. 2. Tiny right pleural effusion. 3. No evidence of active pulmonary disease. 4. No acute process demonstrated in the abdomen or pelvis. No evidence of bowel obstruction or inflammation. Right ovarian cyst measuring 2.3 cm diameter. No follow-up necessary by size criteria. Bilateral hip arthroplasties. Electronically Signed   By: Burman Nieves M.D.   On: 06/18/2018 23:18   Ct Abdomen Pelvis W Contrast  Result Date: 06/18/2018 CLINICAL DATA:  Left flank pain starting 5 days ago. Right leg swelling since Saturday. EXAM: CT ANGIOGRAPHY CHEST CT ABDOMEN AND PELVIS WITH CONTRAST TECHNIQUE: Multidetector CT imaging of the chest was performed using the standard protocol during bolus administration of intravenous contrast. Multiplanar CT image reconstructions and MIPs were obtained to evaluate the vascular anatomy. Multidetector CT imaging of the abdomen and pelvis was performed using the standard protocol during bolus administration of intravenous contrast. CONTRAST:  ISOVUE-370 IOPAMIDOL (ISOVUE-370) INJECTION 76% COMPARISON:  None. FINDINGS: CTA CHEST FINDINGS Cardiovascular: Motion artifact limits examination. There is good opacification of the central and segmental pulmonary arteries. No focal filling defects are demonstrated. No evidence of significant pulmonary embolus. Normal caliber thoracic aorta. Great vessel origins are patent. Normal heart size. No pericardial effusions. Mediastinum/Nodes: No enlarged mediastinal, hilar, or axillary lymph nodes. Thyroid gland, trachea, and esophagus demonstrate no significant findings. Lungs/Pleura: Motion artifact limits examination. No airspace disease or consolidation is suggested. Air cyst in the right costophrenic angle. Tiny right pleural effusion. No pneumothorax. Airways  are patent. Musculoskeletal: Degenerative changes in the spine. Normal alignment. Mild superior endplate compression at T8. Short-segment scoliosis in the upper thoracic spine convex towards the left and centered at T3-4. Review of the MIP images confirms the above findings. CT ABDOMEN and PELVIS FINDINGS Hepatobiliary: No focal liver abnormality is seen. No gallstones, gallbladder wall thickening, or biliary dilatation. Pancreas: Unremarkable. No pancreatic ductal dilatation or surrounding inflammatory changes. Spleen: Normal in size without focal abnormality. Adrenals/Urinary Tract: Adrenal glands are unremarkable. Kidneys are normal, without renal calculi, focal lesion, or hydronephrosis. Bladder is mostly obscured by streak artifact from hip arthroplasties. Stomach/Bowel: Stomach is within normal limits. Appendix appears normal. No evidence of bowel wall thickening, distention, or inflammatory changes. Vascular/Lymphatic: No significant vascular findings are present. No enlarged abdominal or pelvic lymph nodes. Reproductive: Pelvis is mostly obscured by streak artifact. Ovaries are not enlarged. Small cyst on the right ovary measuring 2.3 cm diameter. Other: No free air or free fluid in the abdomen. Abdominal wall musculature appears intact. Musculoskeletal: Bilateral total hip arthroplasties. No acute bony abnormalities identified. Review of the MIP images confirms the above findings. IMPRESSION: 1. No evidence of significant pulmonary embolus. 2. Tiny right pleural effusion. 3. No evidence of active pulmonary disease. 4. No acute process demonstrated in the abdomen or pelvis. No  evidence of bowel obstruction or inflammation. Right ovarian cyst measuring 2.3 cm diameter. No follow-up necessary by size criteria. Bilateral hip arthroplasties. Electronically Signed   By: Burman Nieves M.D.   On: 06/18/2018 23:18    Procedures Procedures (including critical care time)  Medications Ordered in  ED Medications  iopamidol (ISOVUE-370) 76 % injection (has no administration in time range)  sodium chloride 0.9 % injection (has no administration in time range)  iopamidol (ISOVUE-370) 76 % injection 100 mL (100 mLs Intravenous Contrast Given 06/18/18 2249)     Initial Impression / Assessment and Plan / ED Course  I have reviewed the triage vital signs and the nursing notes.  Pertinent labs & imaging results that were available during my care of the patient were reviewed by me and considered in my medical decision making (see chart for details).     She with history of allergy and low syndrome here for evaluation of two complaints. First complaint is left flank pain for one week. Second complaint is right leg swelling for one week. She is non-toxic appearing on examination. She does have tenderness to palpation throughout the flank. UA with hematuria presents. Given her asymmetric lower extremity edema to [place] obtained, this was mildly elevated. CTA chest was obtained that is negative for PE. CT abdomen is negative for obstructing stones. Presentation is not consistent with dissection. Counseled patient on home care for musculoskeletal pain. Discussed importance of return for vascular ultrasound to the right leg tomorrow to complete her evaluation. Discussed incidental findings on CT scan. Return precautions discussed.  Final Clinical Impressions(s) / ED Diagnoses   Final diagnoses:  Flank pain  Peripheral edema    ED Discharge Orders         Ordered    LE VENOUS     06/18/18 2335           Tilden Fossa, MD 06/19/18 0022

## 2018-06-19 ENCOUNTER — Ambulatory Visit (HOSPITAL_COMMUNITY): Admission: RE | Admit: 2018-06-19 | Payer: Medicare Other | Source: Ambulatory Visit | Admitting: Emergency Medicine

## 2018-09-03 DIAGNOSIS — R7303 Prediabetes: Secondary | ICD-10-CM | POA: Insufficient documentation

## 2018-09-03 DIAGNOSIS — J45909 Unspecified asthma, uncomplicated: Secondary | ICD-10-CM | POA: Insufficient documentation

## 2018-09-17 ENCOUNTER — Other Ambulatory Visit (HOSPITAL_COMMUNITY): Payer: Self-pay | Admitting: Family Medicine

## 2018-09-17 ENCOUNTER — Other Ambulatory Visit: Payer: Self-pay | Admitting: Family Medicine

## 2018-09-17 DIAGNOSIS — F988 Other specified behavioral and emotional disorders with onset usually occurring in childhood and adolescence: Secondary | ICD-10-CM

## 2018-09-17 DIAGNOSIS — N83201 Unspecified ovarian cyst, right side: Secondary | ICD-10-CM

## 2018-12-29 DIAGNOSIS — M160 Bilateral primary osteoarthritis of hip: Secondary | ICD-10-CM | POA: Insufficient documentation

## 2019-05-01 IMAGING — CT CT ABD-PELV W/ CM
2 of 4 series · 15 of 46 positions shown, 17 images · IV contrast (ISOVUE)
Comparison: None.

CLINICAL DATA: Left flank pain starting 5 days ago. Right leg
swelling since [REDACTED].

EXAM:
CT ANGIOGRAPHY CHEST
CT ABDOMEN AND PELVIS WITH CONTRAST
TECHNIQUE: Multidetector CT imaging of the chest was performed using the
standard protocol during bolus administration of intravenous
contrast. Multiplanar CT image reconstructions and MIPs were
obtained to evaluate the vascular anatomy. Multidetector CT imaging
of the abdomen and pelvis was performed using the standard protocol
during bolus administration of intravenous contrast.
CONTRAST:  100mL VAJQRA-GL1 IOPAMIDOL (VAJQRA-GL1) INJECTION 76%

[Series 2: axial st · axial · 0.85mm/px · z∈[-520,-120]mm · 12 of 88 slices shown, 14 images]
[im 4/88  soft-tissue]
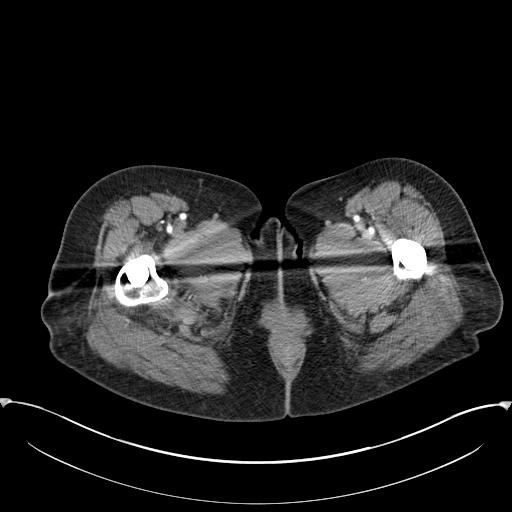
[im 4/88  bone]
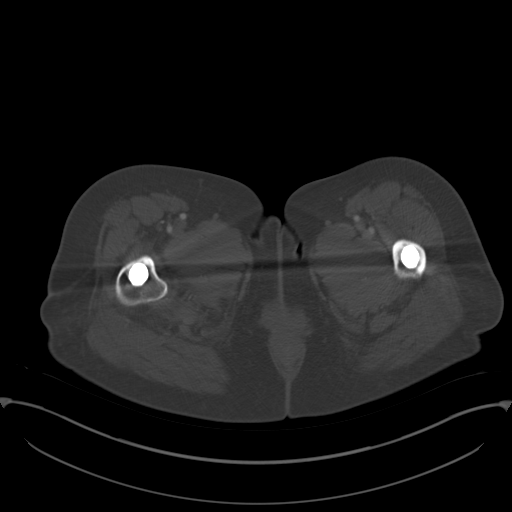
[im 12/88  soft-tissue]
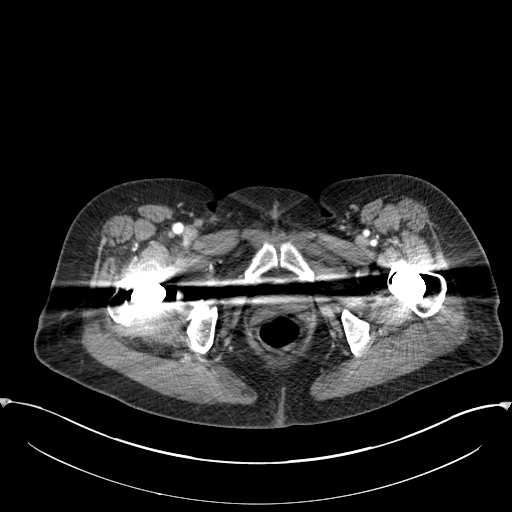
[im 19/88  soft-tissue]
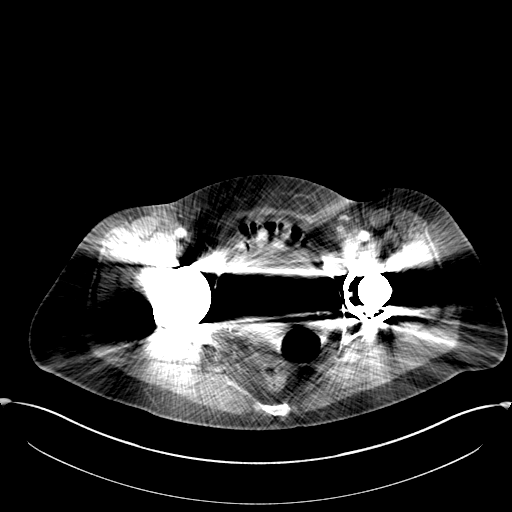
[im 27/88  soft-tissue]
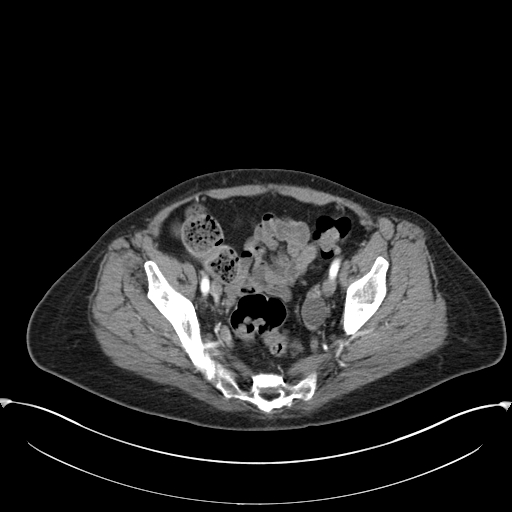
[im 35/88  soft-tissue]
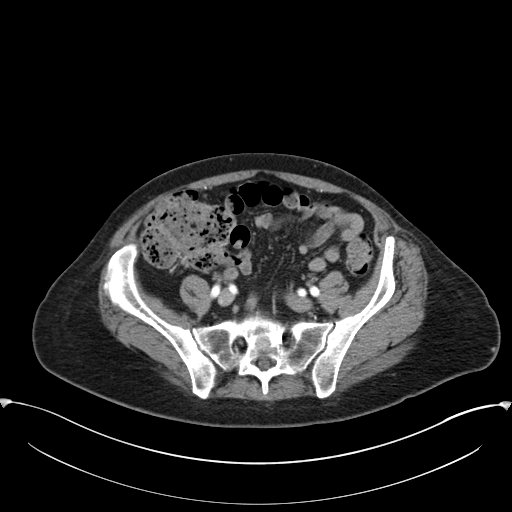
[im 42/88  soft-tissue]
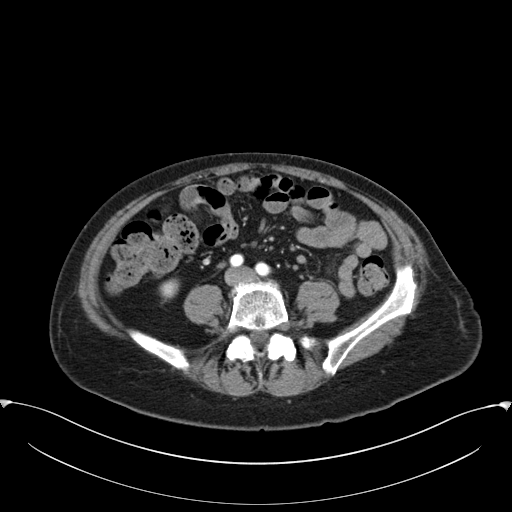
[im 46/88  soft-tissue]
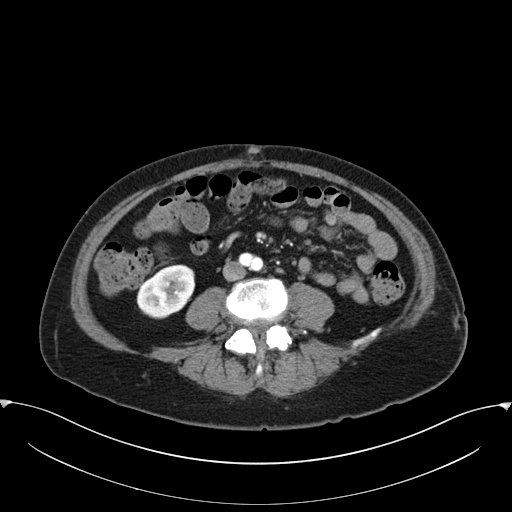
[im 53/88  soft-tissue]
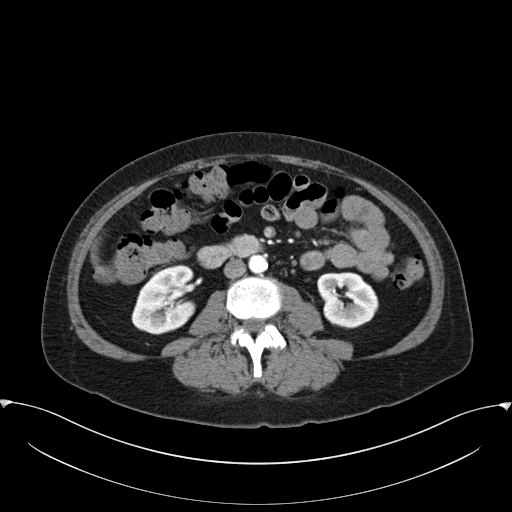
[im 61/88  soft-tissue]
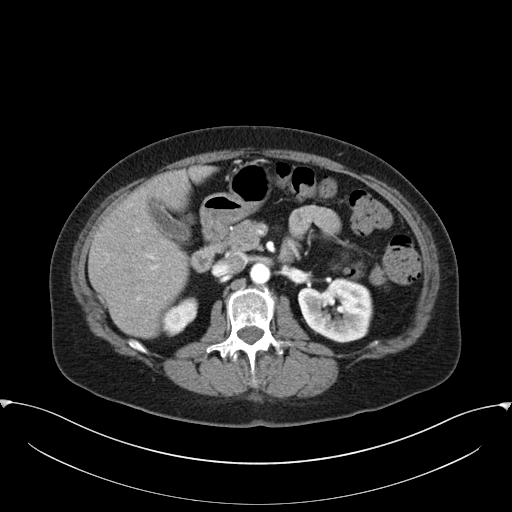
[im 61/88  bone]
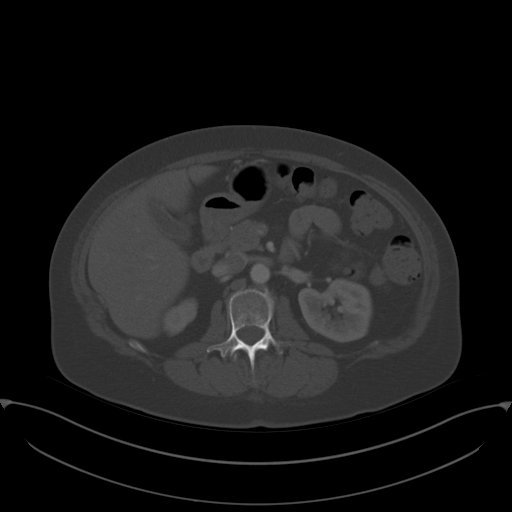
[im 69/88  soft-tissue]
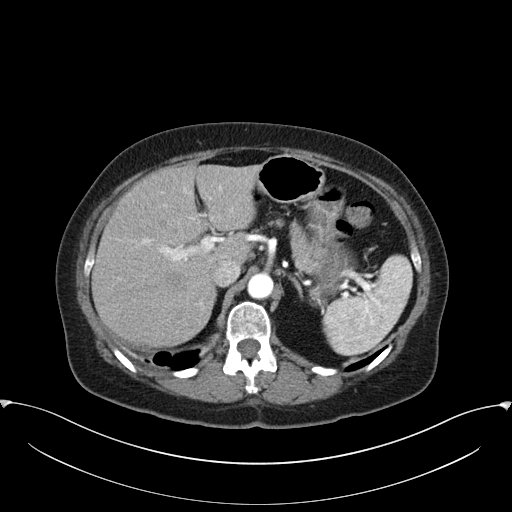
[im 76/88  soft-tissue]
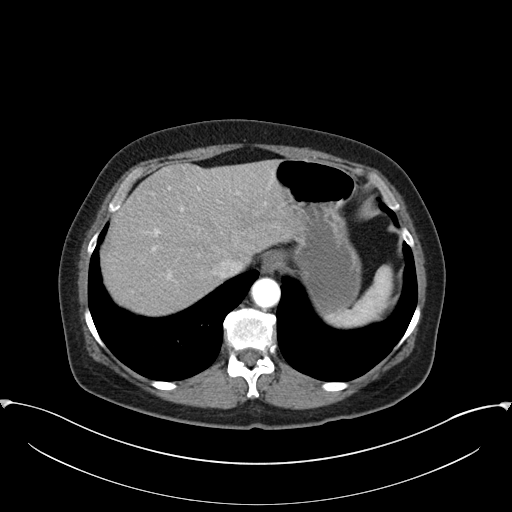
[im 84/88  soft-tissue]
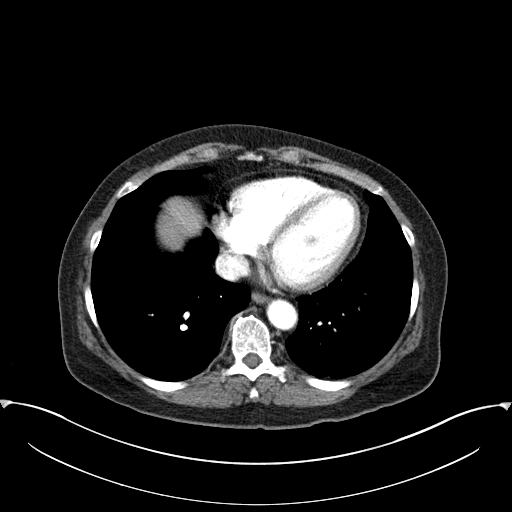

[Series 4: coronal st · coronal · 0.69mm/px · 3 of 87 slices shown]
[im 29/87  soft-tissue]
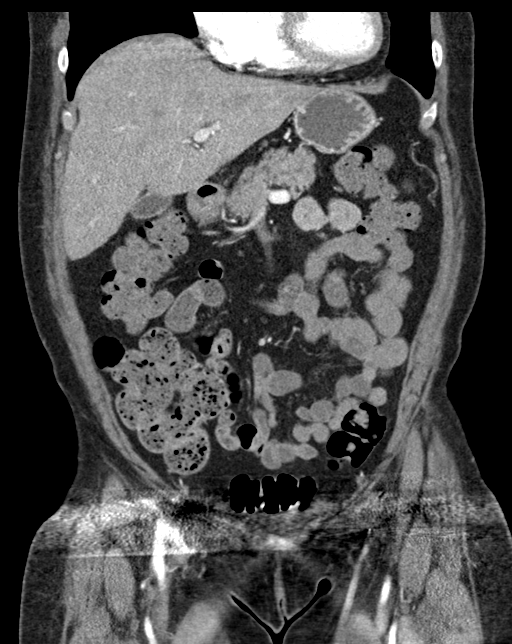
[im 39/87  soft-tissue]
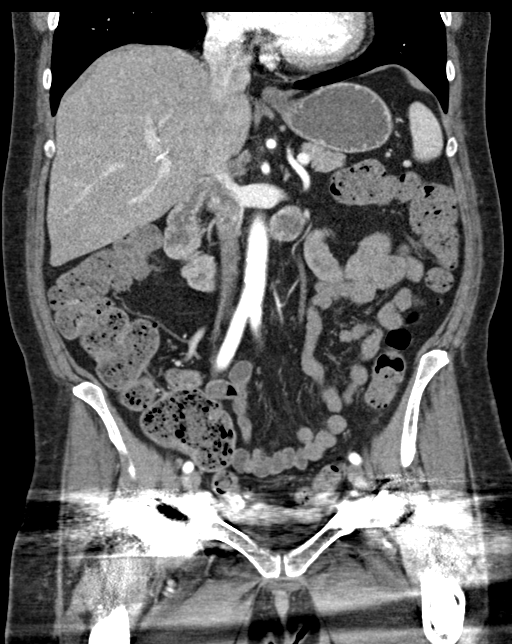
[im 48/87  soft-tissue]
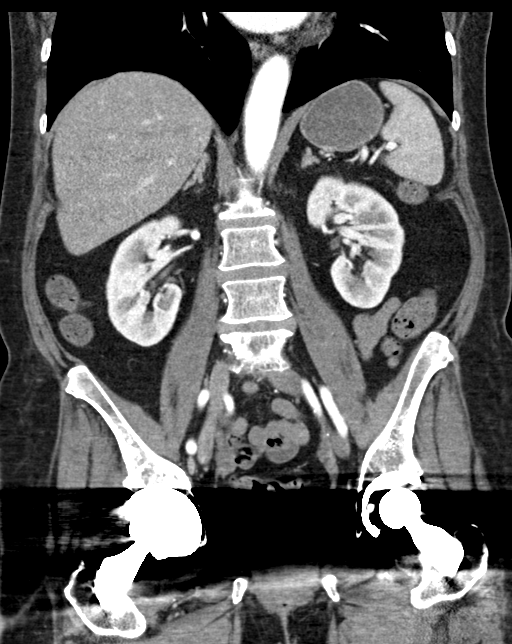

[15 of 46 positions shown; findings below may reference images not displayed]

FINDINGS: CTA CHEST FINDINGS

Cardiovascular: Motion artifact limits examination. There is good
opacification of the central and segmental pulmonary arteries. No
focal filling defects are demonstrated. No evidence of significant
pulmonary embolus. Normal caliber thoracic aorta. Great vessel
origins are patent. Normal heart size. No pericardial effusions.

Mediastinum/Nodes: No enlarged mediastinal, hilar, or axillary lymph
nodes. Thyroid gland, trachea, and esophagus demonstrate no
significant findings.

Lungs/Pleura: Motion artifact limits examination. No airspace
disease or consolidation is suggested. Air cyst in the right
costophrenic angle. Tiny right pleural effusion. No pneumothorax.
Airways are patent.

Musculoskeletal: Degenerative changes in the spine. Normal
alignment. Mild superior endplate compression at T8. Short-segment
scoliosis in the upper thoracic spine convex towards the left and
centered at T3-4.

Review of the MIP images confirms the above findings.

CT ABDOMEN and PELVIS FINDINGS

Hepatobiliary: No focal liver abnormality is seen. No gallstones,
gallbladder wall thickening, or biliary dilatation.

Pancreas: Unremarkable. No pancreatic ductal dilatation or
surrounding inflammatory changes.

Spleen: Normal in size without focal abnormality.

Adrenals/Urinary Tract: Adrenal glands are unremarkable. Kidneys are
normal, without renal calculi, focal lesion, or hydronephrosis.
Bladder is mostly obscured by streak artifact from hip
arthroplasties..

Stomach/Bowel: Stomach is within normal limits. Appendix appears
normal. No evidence of bowel wall thickening, distention, or
inflammatory changes.

Vascular/Lymphatic: No significant vascular findings are present. No
enlarged abdominal or pelvic lymph nodes.

Reproductive: Pelvis is mostly obscured by streak artifact. Ovaries
are not enlarged. Small cyst on the right ovary measuring 2.3 cm
diameter.

Other: No free air or free fluid in the abdomen. Abdominal wall
musculature appears intact.

Musculoskeletal: Bilateral total hip arthroplasties. No acute bony
abnormalities identified.

Review of the MIP images confirms the above findings.
IMPRESSION: 1. No evidence of significant pulmonary embolus.
2. Tiny right pleural effusion.
3. No evidence of active pulmonary disease.
4. No acute process demonstrated in the abdomen or pelvis. No
evidence of bowel obstruction or inflammation. Right ovarian cyst
measuring 2.3 cm diameter. No follow-up necessary by size criteria.
Bilateral hip arthroplasties.

## 2020-12-22 NOTE — Progress Notes (Deleted)
Office Visit Note  Patient: [name]             Date of Birth: 07-26-1956           MRN: [id number]             PCP: Oneita Hurt, No Referring: Thyra Breed, MD Visit Date: 01/04/2021 Occupation: @GUAROCC @  Subjective:  No chief complaint on file.   History of Present Illness: [name] is a 65 y.o. female ***   Activities of Daily Living:  Patient reports morning stiffness for *** {minute/hour:19697}.   Patient {ACTIONS;DENIES/REPORTS:21021675::"Denies"} nocturnal pain.  Difficulty dressing/grooming: {ACTIONS;DENIES/REPORTS:21021675::"Denies"} Difficulty climbing stairs: {ACTIONS;DENIES/REPORTS:21021675::"Denies"} Difficulty getting out of chair: {ACTIONS;DENIES/REPORTS:21021675::"Denies"} Difficulty using hands for taps, buttons, cutlery, and/or writing: {ACTIONS;DENIES/REPORTS:21021675::"Denies"}  No Rheumatology ROS completed.   PMFS History:  Patient Active Problem List   Diagnosis Date Noted  . HYPERLIPIDEMIA 01/11/2009  . 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 ALLERGIC RHINITIS 01/11/2009  . EHLERS-DANLOS SYNDROME 01/11/2009  . DYSPNEA 01/11/2009    Past Medical History:  Diagnosis Date  . Chronic back pain   . EDS (Ehlers-Danlos syndrome)     No family history on file. Past Surgical History:  Procedure Laterality Date  . JOINT REPLACEMENT     2 shoulders, 4 hips  . MASTECTOMY    . NECK SURGERY     Social History   Social History Narrative  . Not on file   Immunization History  Administered Date(s) Administered  . PFIZER(Purple Top)SARS-COV-2 Vaccination 05/26/2020     Objective: Vital Signs: There were no vitals taken for this visit.   Physical Exam   Musculoskeletal Exam: ***  CDAI Exam: CDAI Score: -- Patient Global: --; Provider Global: -- Swollen: --; Tender: -- Joint Exam 01/04/2021   No joint exam has been documented for this visit   There is currently no information documented on the homunculus. Go to the Rheumatology  activity and complete the homunculus joint exam.  Investigation: No additional findings.  Imaging: No results found.  Recent Labs: Lab Results  Component Value Date   WBC 11.2 (H) 06/18/2018   HGB 14.0 06/18/2018   PLT 336 06/18/2018   NA 143 06/18/2018   K 4.0 06/18/2018   CL 104 06/18/2018   CO2 29 06/18/2018   GLUCOSE 136 (H) 06/18/2018   BUN 13 06/18/2018   CREATININE 0.71 06/18/2018   BILITOT 0.3 11/11/2012   ALKPHOS 94 11/11/2012   AST 19 11/11/2012   ALT 19 11/11/2012   PROT 8.6 (H) 11/11/2012   ALBUMIN 3.9 11/11/2012   CALCIUM 9.6 06/18/2018   GFRAA >60 06/18/2018    Speciality Comments: No specialty comments available.  Procedures:  No procedures performed Allergies: Escitalopram oxalate, Gluten meal, Platinum-containing compounds, Cobalt, Nickel, Tape, Bacitracin, Gold, Neomycin, Pneumococcal vaccines, and Sulfonamide derivatives   Assessment / Plan:     Visit Diagnoses: Positive ANA (antinuclear antibody)  Polyarthralgia  Ehlers-Danlos syndrome  Chronic pain syndrome  Family history of scleroderma - Mother and aunt  History of Lyme disease  History of breast cancer  History of diverticulitis  Anxiety and depression  History of hyperlipidemia  Orders: No orders of the defined types were placed in this encounter.  No orders of the defined types were placed in this encounter.   Face-to-face time spent with patient was *** minutes. Greater than 50% of time was spent in counseling and coordination of care.  Follow-Up Instructions: No follow-ups on file.   06/20/2018, PA-C  Note - This record  has been created using [employer].  Chart creation errors have been sought, but may not always  have been located. Such creation errors do not reflect on  the standard of medical care.

## 2021-01-04 ENCOUNTER — Ambulatory Visit: Payer: Medicare Other | Admitting: Rheumatology

## 2021-01-04 DIAGNOSIS — G894 Chronic pain syndrome: Secondary | ICD-10-CM

## 2021-01-04 DIAGNOSIS — Z8719 Personal history of other diseases of the digestive system: Secondary | ICD-10-CM

## 2021-01-04 DIAGNOSIS — M2550 Pain in unspecified joint: Secondary | ICD-10-CM

## 2021-01-04 DIAGNOSIS — Q796 Ehlers-Danlos syndrome, unspecified: Secondary | ICD-10-CM

## 2021-01-04 DIAGNOSIS — Z8269 Family history of other diseases of the musculoskeletal system and connective tissue: Secondary | ICD-10-CM

## 2021-01-04 DIAGNOSIS — R768 Other specified abnormal immunological findings in serum: Secondary | ICD-10-CM

## 2021-01-04 DIAGNOSIS — Z8619 Personal history of other infectious and parasitic diseases: Secondary | ICD-10-CM

## 2021-01-04 DIAGNOSIS — F419 Anxiety disorder, unspecified: Secondary | ICD-10-CM

## 2021-01-04 DIAGNOSIS — Z8639 Personal history of other endocrine, nutritional and metabolic disease: Secondary | ICD-10-CM

## 2021-01-04 DIAGNOSIS — Z853 Personal history of malignant neoplasm of breast: Secondary | ICD-10-CM

## 2021-02-06 NOTE — Progress Notes (Signed)
Office Visit Note  Patient: [name]             Date of Birth: [birth year]           MRN: [id number]             PCP: Rolm Gala, MD Referring: Thyra Breed, MD Visit Date: 02/20/2021 Occupation: @GUAROCC @  Subjective:  Dry mouth and dry eyes.   History of Present Illness: [name] is a 65 y.o. female came here for second opinion regarding positive ANA.  Patient was diagnosed with Ehlers-Danlos syndrome in 2000 at [facility].  Her daughter also has Ehlers-Danlos syndrome.  She saw me in the early 2000's for her Ehlers-Danlos syndrome.  At the time isometric exercises were advised.  She was also diagnosed with osteoarthritis in multiple joints over the years.  She has had bilateral total hip replacements and 3 revisions of her right total hip replacement.  She has been also diagnosed with bilateral shoulder joint osteoarthritis and is scheduled to have left total shoulder replacement in the future.  She gives history of osteoarthritis in her knees and had meniscal tear repair for bilateral knee joints.  She is osteoarthritis involving her hands and her feet.  She states she has been experiencing dry mouth and dry eyes symptoms recently.  She also has dry skin.  She has had sores on her scalp for many years for which she has been seeing a dermatologist without much help.  There is no history of oral ulcers, nasal ulcers, malar rash, photosensitivity, Raynaud's phenomenon or lymphadenopathy.  There is no history of inflammatory arthritis.  She states her PCP recently did labs and her ANA was positive.  She was referred to Dr. 09-09-1986 for evaluation and he did not agree with the diagnosis of autoimmune disease.  He obtained AVISE labs in December 2021.  According the patient she was told that those labs were negative.  Activities of Daily Living:  Patient reports morning stiffness for 0 minutes.   Patient Reports nocturnal pain.  Difficulty dressing/grooming:  Reports Difficulty climbing stairs: Reports Difficulty getting out of chair: Reports Difficulty using hands for taps, buttons, cutlery, and/or writing: Reports  Review of Systems  Constitutional:  Positive for fatigue.  HENT:  Positive for mouth dryness and nose dryness. Negative for mouth sores.   Eyes:  Positive for pain, itching and dryness.  Respiratory:  Positive for shortness of breath and difficulty breathing.   Cardiovascular:  Negative for chest pain and palpitations.  Gastrointestinal:  Negative for blood in stool, constipation and diarrhea.  Endocrine: Negative for increased urination.  Genitourinary:  Negative for difficulty urinating.  Musculoskeletal:  Positive for joint pain, joint pain, myalgias, muscle tenderness and myalgias. Negative for joint swelling and morning stiffness.  Skin:  Positive for rash and ulcers. Negative for color change.  Allergic/Immunologic: Positive for susceptible to infections.  Neurological:  Positive for numbness and weakness. Negative for dizziness, headaches and memory loss.  Hematological:  Positive for bruising/bleeding tendency.  Psychiatric/Behavioral:  Negative for confusion.    PMFS History:  Patient Active Problem List   Diagnosis Date Noted   HYPERLIPIDEMIA 01/11/2009   TOURETTE'S DISORDER 01/11/2009   ALLERGIC RHINITIS 01/11/2009   EHLERS-DANLOS SYNDROME 01/11/2009   DYSPNEA 01/11/2009    Past Medical History:  Diagnosis Date   Chronic back pain    EDS (Ehlers-Danlos syndrome)     Family History  Problem Relation Age of Onset   COPD Mother  Diabetes Mother    Scleroderma Mother    Diabetes Father    Alzheimer's disease Father    COPD Father    Ehlers-Danlos syndrome Daughter    Past Surgical History:  Procedure Laterality Date   ANKLE SURGERY Right    APPENDECTOMY     JOINT REPLACEMENT     4 right hip replacements and 1 left hip replacement   MASTECTOMY Right    MENISCUS REPAIR Bilateral    NECK SURGERY      PARTIAL HYSTERECTOMY     SHOULDER SURGERY     x2   Social History   Social History Narrative   Not on file   Immunization History  Administered Date(s) Administered   PFIZER(Purple Top)SARS-COV-2 Vaccination 11/14/2019, 12/06/2019, 05/26/2020, 12/29/2020     Objective: Vital Signs: BP 117/80 (BP Location: Left Arm, Patient Position: Sitting, Cuff Size: Normal)   Pulse 65   Resp 15   Ht 5\' 5"  (1.651 m) Comment: per patient, patient in wheelchair  Wt 180 lb (81.6 kg) Comment: per patient, patient in wheelchair  BMI 29.95 kg/m    Physical Exam Vitals and nursing note reviewed.  Constitutional:      Appearance: She is well-developed.  HENT:     Head: Normocephalic and atraumatic.  Eyes:     Conjunctiva/sclera: Conjunctivae normal.  Cardiovascular:     Rate and Rhythm: Normal rate and regular rhythm.     Heart sounds: Normal heart sounds.  Pulmonary:     Effort: Pulmonary effort is normal.     Breath sounds: Normal breath sounds.  Abdominal:     General: Bowel sounds are normal.     Palpations: Abdomen is soft.  Musculoskeletal:     Cervical back: Normal range of motion.  Lymphadenopathy:     Cervical: No cervical adenopathy.  Skin:    General: Skin is warm and dry.     Capillary Refill: Capillary refill takes less than 2 seconds.     Comments: No nailbed capillary changes were noted.  No sclerodactyly was noted.  Neurological:     Mental Status: She is alert and oriented to person, place, and time.  Psychiatric:        Behavior: Behavior normal.     Musculoskeletal Exam: C-spine was in good range of motion.  Shoulder joints with good range of motion with some discomfort.  Elbow joints with good range of motion.  She had no tenderness over MCPs PIPs or DIPs with no synovitis.  Her hip joints are replaced.  She has osteoarthritis in her knee joints with full range of motion without any warmth swelling or effusion.  Right foot was in a cast due to recent fracture.  Left  ankle and MTPs had no tenderness on palpation.  CDAI Exam: CDAI Score: -- Patient Global: --; Provider Global: -- Swollen: --; Tender: -- Joint Exam 02/20/2021   No joint exam has been documented for this visit   There is currently no information documented on the homunculus. Go to the Rheumatology activity and complete the homunculus joint exam.  Investigation: No additional findings.  Imaging: No results found.  Recent Labs: Lab Results  Component Value Date   WBC 11.2 (H) 06/18/2018   HGB 14.0 06/18/2018   PLT 336 06/18/2018   NA 143 06/18/2018   K 4.0 06/18/2018   CL 104 06/18/2018   CO2 29 06/18/2018   GLUCOSE 136 (H) 06/18/2018   BUN 13 06/18/2018   CREATININE 0.71 06/18/2018   BILITOT 0.3  11/11/2012   ALKPHOS 94 11/11/2012   AST 19 11/11/2012   ALT 19 11/11/2012   PROT 8.6 (H) 11/11/2012   ALBUMIN 3.9 11/11/2012   CALCIUM 9.6 06/18/2018   GFRAA >60 06/18/2018    December 13, 2020 CBC WBC 11.1, hemoglobin 16.1, platelets 268, CRP normal, July 06, 2020 ANA 1: 640NS,NH, Smith negative, RNP negative, Ro negative, La negative  Speciality Comments: No specialty comments available.  Procedures:  No procedures performed Allergies: Escitalopram oxalate, Platinum-containing compounds, Cobalt, Nickel, Tape, Bacitracin, Gold, Neomycin, Pneumococcal vaccines, and Sulfonamide derivatives   Assessment / Plan:     Visit Diagnoses: Positive ANA (antinuclear antibody) - Second opinion. Pt at [facility] rheum-Dr. Allena Katz. 07/06/20: ANA 1:640speckled, 1:160H, (sm 6, RNP 14, Ro 6, La 2, were all negative).  She gives history of sicca, nasal ulcers, photosensitivity.  She has dry mouth and dry eyes.  She takes several medications which can be contributing to dry mouth.  There is no history of oral ulcers, malar rash, Raynaud's phenomenon or sclerodactyly.  Patient states that she had AVISE labs done by Dr. Allena Katz in December which were negative.  I do not have the results available.  I  advised her to forward the AVISE labs to me.  She was advised to return for a follow-up visit in case she develops new symptoms.  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  Patient's main concern with the scleroderma.  She has no clinical features of scleroderma.  Primary osteoarthritis of both shoulders - Chronic pain in both shoulders.  She is scheduled to have left total shoulder replacement.  Primary osteoarthritis of both hands-joint protection muscle strengthening was discussed.  Status post bilateral total hip replacement - Right total hip replacement revision x3, left total hip replacement.  Primary osteoarthritis of both knees - Status post bilateral meniscal tear repair.  She has chronic pain.  Primary osteoarthritis of both feet-she has chronic discomfort.  DDD (degenerative disc disease), lumbar-she is in chronic pain and is mostly in the wheelchair.  EDS (Ehlers-Danlos syndrome)-diagnosed at [facility].  Her daughter also has EDS.  History of tics - History of facial and hand tics  Chronic pain syndrome  History of hyperlipidemia  History of diverticulosis - Followed by [facility] gastroenterology.  Anxiety and depression  History of breast cancer - 1999 right mastectomy only.  Family history of scleroderma - Mother and Aunt  Orders: No orders of the defined types were placed in this encounter.  No orders of the defined types were placed in this encounter.    Follow-Up Instructions: Return if symptoms worsen or fail to improve, for +ANA.   Marie Savoy, MD  Note - This record has been created using [employer].  Chart creation errors have been sought, but may not always  have been located. Such creation errors do not reflect on  the standard of medical care.

## 2021-02-20 ENCOUNTER — Other Ambulatory Visit: Payer: Self-pay

## 2021-02-20 ENCOUNTER — Encounter: Payer: Self-pay | Admitting: Rheumatology

## 2021-02-20 ENCOUNTER — Ambulatory Visit (INDEPENDENT_AMBULATORY_CARE_PROVIDER_SITE_OTHER): Payer: Medicare Other | Admitting: Rheumatology

## 2021-02-20 VITALS — BP 117/80 | HR 65 | Resp 15 | Ht 65.0 in | Wt 180.0 lb

## 2021-02-20 DIAGNOSIS — M47816 Spondylosis without myelopathy or radiculopathy, lumbar region: Secondary | ICD-10-CM

## 2021-02-20 DIAGNOSIS — G894 Chronic pain syndrome: Secondary | ICD-10-CM

## 2021-02-20 DIAGNOSIS — R768 Other specified abnormal immunological findings in serum: Secondary | ICD-10-CM | POA: Diagnosis not present

## 2021-02-20 DIAGNOSIS — M3500 Sicca syndrome, unspecified: Secondary | ICD-10-CM

## 2021-02-20 DIAGNOSIS — Z96643 Presence of artificial hip joint, bilateral: Secondary | ICD-10-CM

## 2021-02-20 DIAGNOSIS — M19072 Primary osteoarthritis, left ankle and foot: Secondary | ICD-10-CM

## 2021-02-20 DIAGNOSIS — M19071 Primary osteoarthritis, right ankle and foot: Secondary | ICD-10-CM

## 2021-02-20 DIAGNOSIS — F952 Tourette's disorder: Secondary | ICD-10-CM

## 2021-02-20 DIAGNOSIS — M19042 Primary osteoarthritis, left hand: Secondary | ICD-10-CM

## 2021-02-20 DIAGNOSIS — F419 Anxiety disorder, unspecified: Secondary | ICD-10-CM

## 2021-02-20 DIAGNOSIS — M19012 Primary osteoarthritis, left shoulder: Secondary | ICD-10-CM

## 2021-02-20 DIAGNOSIS — M19041 Primary osteoarthritis, right hand: Secondary | ICD-10-CM | POA: Diagnosis not present

## 2021-02-20 DIAGNOSIS — M170 Bilateral primary osteoarthritis of knee: Secondary | ICD-10-CM

## 2021-02-20 DIAGNOSIS — Z8719 Personal history of other diseases of the digestive system: Secondary | ICD-10-CM

## 2021-02-20 DIAGNOSIS — Z8619 Personal history of other infectious and parasitic diseases: Secondary | ICD-10-CM

## 2021-02-20 DIAGNOSIS — Z8639 Personal history of other endocrine, nutritional and metabolic disease: Secondary | ICD-10-CM

## 2021-02-20 DIAGNOSIS — M19011 Primary osteoarthritis, right shoulder: Secondary | ICD-10-CM | POA: Diagnosis not present

## 2021-02-20 DIAGNOSIS — Z853 Personal history of malignant neoplasm of breast: Secondary | ICD-10-CM

## 2021-02-20 DIAGNOSIS — Z8659 Personal history of other mental and behavioral disorders: Secondary | ICD-10-CM

## 2021-02-20 DIAGNOSIS — M2550 Pain in unspecified joint: Secondary | ICD-10-CM

## 2021-02-20 DIAGNOSIS — M5136 Other intervertebral disc degeneration, lumbar region: Secondary | ICD-10-CM

## 2021-02-20 DIAGNOSIS — Q796 Ehlers-Danlos syndrome, unspecified: Secondary | ICD-10-CM

## 2021-02-20 DIAGNOSIS — F32A Depression, unspecified: Secondary | ICD-10-CM

## 2021-02-20 DIAGNOSIS — Z8269 Family history of other diseases of the musculoskeletal system and connective tissue: Secondary | ICD-10-CM

## 2022-02-13 ENCOUNTER — Other Ambulatory Visit (HOSPITAL_COMMUNITY): Payer: Self-pay | Admitting: Family Medicine

## 2022-02-13 ENCOUNTER — Telehealth (HOSPITAL_COMMUNITY): Payer: Self-pay | Admitting: Radiology

## 2022-02-13 DIAGNOSIS — R0602 Shortness of breath: Secondary | ICD-10-CM

## 2022-02-13 NOTE — Telephone Encounter (Signed)
Spoke with [name] at Dr. [place] office. I informed her of the decision to change the dobutamine stress echocardiogram to a transthoracic echo per Dr. Rennis Golden. Dr. Gavin Potters will need to send a new order and get insurance pre-authorization, if needed. She will forward the message to Dr. Gavin Potters.

## 2022-03-02 ENCOUNTER — Ambulatory Visit (HOSPITAL_COMMUNITY): Payer: Medicare Other | Attending: Cardiology

## 2022-03-02 DIAGNOSIS — R0602 Shortness of breath: Secondary | ICD-10-CM | POA: Insufficient documentation

## 2022-03-02 LAB — ECHOCARDIOGRAM COMPLETE
Area-P 1/2: 2.94 cm2
S' Lateral: 3.9 cm

## 2022-05-02 ENCOUNTER — Ambulatory Visit: Payer: Medicare Other | Attending: Internal Medicine | Admitting: Internal Medicine

## 2022-05-02 ENCOUNTER — Encounter: Payer: Self-pay | Admitting: Internal Medicine

## 2022-05-02 VITALS — BP 133/84 | HR 84 | Ht 65.0 in | Wt 183.0 lb

## 2022-05-02 DIAGNOSIS — R0602 Shortness of breath: Secondary | ICD-10-CM | POA: Diagnosis not present

## 2022-05-02 DIAGNOSIS — R072 Precordial pain: Secondary | ICD-10-CM | POA: Diagnosis not present

## 2022-05-02 DIAGNOSIS — R0789 Other chest pain: Secondary | ICD-10-CM | POA: Insufficient documentation

## 2022-05-02 DIAGNOSIS — Z8249 Family history of ischemic heart disease and other diseases of the circulatory system: Secondary | ICD-10-CM | POA: Diagnosis not present

## 2022-05-02 MED ORDER — METOPROLOL TARTRATE 50 MG PO TABS: 50.0000 mg | ORAL_TABLET | ORAL | 0 refills | Status: DC

## 2022-05-02 NOTE — Progress Notes (Signed)
OFFICE CONSULT NOTE  Chief Complaint:  Chest pain/dyspnea  Primary Care Physician: Marie Gala, MD  HPI:  [name] is a 67 y.o. female who is being seen today for the evaluation of chest pain/dyspnea at the request of Marie Gala, MD. this is a pleasant 66 year old female kindly referred for evaluation and management of shortness of breath with exertion and chest tightness.  She has a history of type III Ehlers-Danlos syndrome which was diagnosed and followed at [facility].  She is also had numerous orthopedic issues including hip dislocation and recent trimalleolar fracture on the right ankle requiring long-term casting.  She also has a history of dyslipidemia.  Recent labs showed total cholesterol 241, triglycerides 154, HDL 58 and LDL 152, not on statin therapy.  She reports family history of heart disease including her mother who died of heart disease and had three-vessel CABG in her 86s.  Her father also had coronary artery disease.  She has been more sedentary due to her right ankle fracture.  EKG today shows normal sinus rhythm with sinus arrhythmia and nonspecific ST and T wave changes.  She also reports some occasional chest fluttering.  Her shortness of breath seems to be associated with exertion and improved by rest.  Her PCP ordered recent echocardiogram which demonstrated LVEF 60 to 65%, mild LVH and grade 1 diastolic dysfunction, normal RVSP, mild mitral regurgitation but otherwise no abnormalities.  The aorta was not dilated.  PMHx:  Past Medical History:  Diagnosis Date   Chronic back pain    EDS (Ehlers-Danlos syndrome)     Past Surgical History:  Procedure Laterality Date   ANKLE SURGERY Right    APPENDECTOMY     JOINT REPLACEMENT     4 right hip replacements and 1 left hip replacement   MASTECTOMY Right    MENISCUS REPAIR Bilateral    NECK SURGERY     PARTIAL HYSTERECTOMY     SHOULDER SURGERY     x2    FAMHx:  Family History  Problem Relation Age of  Onset   COPD Mother    Diabetes Mother    Scleroderma Mother    Diabetes Father    Alzheimer's disease Father    COPD Father    Ehlers-Danlos syndrome Daughter     SOCHx:   reports that she quit smoking about 23 years ago. Her smoking use included cigarettes. She smoked an average of 1 pack per day. She has never used smokeless tobacco. She reports that she does not drink alcohol and does not use drugs.  ALLERGIES:  Allergies  Allergen Reactions   Escitalopram Oxalate Shortness Of Breath   Platinum-Containing Compounds Rash    Patient states she has an allergy to palladium which is a component of the platinum group.   Cobalt Rash   Hydrochlorothiazide Rash    Significant leg swelling   Nickel Rash   Petrolatum    Tape Other (See Comments)    Skin comes off and rash Tegaderm ok   Bacitracin Rash    allergic contact dermatitis   Gold Rash    allergic contact dermatitis   Neomycin Rash    Allergic contact dermatitis   Pneumococcal Vaccines Swelling and Rash   Sulfonamide Derivatives Rash    ROS: Pertinent items noted in HPI and remainder of comprehensive ROS otherwise negative.  HOME MEDS: Current Outpatient Medications on File Prior to Visit  Medication Sig Dispense Refill   albuterol (VENTOLIN HFA) 108 (90 Base) MCG/ACT inhaler  Inhale into the lungs as needed.     ascorbic acid (VITAMIN C) 500 MG tablet Take by mouth.     aspirin EC 325 MG tablet Take by mouth.     cetirizine (ZYRTEC) 10 MG tablet Take 10 mg by mouth daily.     Cholecalciferol (VITAMIN D) 2000 UNITS tablet Take 2,000 Units by mouth 3 (three) times a week.      clobetasol (TEMOVATE) 0.05 % external solution daily.     dextroamphetamine (DEXTROSTAT) 10 MG tablet Take 5 mg by mouth 2 (two) times daily with breakfast and lunch.   0   fluocinonide (LIDEX) 0.05 % external solution Apply topically.     methocarbamol (ROBAXIN) 500 MG tablet Take by mouth as needed.     nitroGLYCERIN (NITROSTAT) 0.4 MG SL  tablet Place 0.4 mg under the tongue every 5 (five) minutes as needed for chest pain.     oxyCODONE (OXY IR/ROXICODONE) 5 MG immediate release tablet Take by mouth.     polyethylene glycol powder (GLYCOLAX/MIRALAX) 17 GM/SCOOP powder Take by mouth.     trazodone (DESYREL) 300 MG tablet Take 300 mg by mouth at bedtime.  2   No current facility-administered medications on file prior to visit.    LABS/IMAGING: No results found for this or any previous visit (from the past 48 hour(s)). No results found.  LIPID PANEL: No results found for: "CHOL", "TRIG", "HDL", "CHOLHDL", "VLDL", "LDLCALC", "LDLDIRECT"  WEIGHTS: Wt Readings from Last 3 Encounters:  05/02/22 183 lb (83 kg)  02/20/21 180 lb (81.6 kg)  06/18/18 157 lb (71.2 kg)    VITALS: BP 133/84   Pulse 84   Ht 5\' 5"  (1.651 m)   Wt 183 lb (83 kg)   SpO2 95%   BMI 30.45 kg/m   EXAM: General appearance: alert, no distress, and in wheelchair Neck: no carotid bruit, no JVD, and thyroid not enlarged, symmetric, no tenderness/mass/nodules Lungs: clear to auscultation bilaterally Heart: regular rate and rhythm, S1, S2 normal, and systolic murmur: early systolic 2/6, blowing at apex Abdomen: soft, non-tender; bowel sounds normal; no masses,  no organomegaly Extremities: Right ankle cast Pulses: 2+ and symmetric Skin: Skin color, texture, turgor normal. No rashes or lesions Neurologic: Grossly normal Psych: Pleasant  EKG: Normal sinus rhythm with sinus arrhythmia, nonspecific ST and T wave changes 84- personally reviewed  ASSESSMENT: Progressive dyspnea on exertion/chest tightness, possible angina Family history of premature coronary disease in her mother Dyslipidemia Type III Ehlers-Danlos syndrome  PLAN: 1.   Ms. [name] is having progressive dyspnea on exertion/chest tightness which is possibly an anginal equivalent.  She has a strong family history of early onset heart disease.  Recent echo was fairly unremarkable except  for some mild diastolic dysfunction and mild mitral regurgitation.  Cholesterol is higher than ideal and likely will need treatment.  No evidence of aortic aneurysm on the echo however this only evaluates a small portion of the ascending aorta.  Based on my concerns I would like to get a CT coronary angiogram.  We will pretreat with 50 mg metoprolol.  Based on the findings we can tailor further therapy such as a statin or other medications.  Plan follow-up with me afterwards.  Thanks again for the kind referral.  Earlene Plater, MD, [facility]  [place]  [facility]  Medical Director of the Advanced Lipid Disorders &  Cardiovascular Risk Reduction Clinic Diplomate of the American Board of Clinical Lipidology Attending Cardiologist  Direct Dial: [phone]  Fax: [phone]  Website:  [email or web address]  Lisette Abu Roxie Kreeger 05/02/2022, 3:11 PM

## 2022-05-02 NOTE — Patient Instructions (Signed)
Medication Instructions:  NO CHANGES  Take a one-time dose of metoprolol tartrate TWO HOURS before CT test  *If you need a refill on your cardiac medications before your next appointment, please call your pharmacy*   Lab Work: Non-Fasting BMET prior to CT test  If you have labs (blood work) drawn today and your tests are completely normal, you will receive your results only by: MyChart Message (if you have MyChart) OR A paper copy in the mail If you have any lab test that is abnormal or we need to change your treatment, we will call you to review the results.   Testing/Procedures:  Coronary CT at [facility]   Follow-Up: At [facility], you and your health needs are our priority.  As part of our continuing mission to provide you with exceptional heart care, we have created designated Provider Care Teams.  These Care Teams include your primary Cardiologist (physician) and Advanced Practice Providers (APPs -  Physician Assistants and Nurse Practitioners) who all work together to provide you with the care you need, when you need it.  We recommend signing up for the patient portal called "MyChart".  Sign up information is provided on this After Visit Summary.  MyChart is used to connect with patients for Virtual Visits (Telemedicine).  Patients are able to view lab/test results, encounter notes, upcoming appointments, etc.  Non-urgent messages can be sent to your provider as well.   To learn more about what you can do with MyChart, go to [email or web address].    Your next appointment:   1-2 months with Dr. Rennis Golden   Other Instructions   Your cardiac CT will be scheduled at one of the below locations:   [facility] [address] [place], [place] [address] [phone]  OR  [facility] [address] [place], [place] [address] [phone]  If scheduled at [facility], please arrive at the  [facility] and Children's Entrance (Entrance C2) of [facility] 30 minutes prior to test start time. You can use the FREE valet parking offered at entrance C (encouraged to control the heart rate for the test)  Proceed to the [facility] Radiology Department (first floor) to check-in and test prep.  All radiology patients and guests should use entrance C2 at [facility], accessed from [facility], even though the hospital's physical address listed is [address].    If scheduled at [facility], please arrive 15 mins early for check-in and test prep.  Please follow these instructions carefully (unless otherwise directed):  Hold all erectile dysfunction medications at least 3 days (72 hrs) prior to test.  On the Night Before the Test: Be sure to Drink plenty of water. Do not consume any caffeinated/decaffeinated beverages or chocolate 12 hours prior to your test. Do not take any antihistamines 12 hours prior to your test.  On the Day of the Test: Drink plenty of water until 1 hour prior to the test. Do not eat any food 4 hours prior to the test. You may take your regular medications prior to the test.  Take metoprolol (Lopressor) two hours prior to test. HOLD Furosemide/Hydrochlorothiazide morning of the test. FEMALES- please wear underwire-free bra if available, avoid dresses & tight clothing  After the Test: Drink plenty of water. After receiving IV contrast, you may experience a mild flushed feeling. This is normal. On occasion, you may experience a mild rash up to 24  hours after the test. This is not dangerous. If this occurs, you can take Benadryl 25 mg and increase your fluid intake. If you experience trouble breathing, this can be serious. If it is severe call 911 IMMEDIATELY. If it is mild, please call our office. If you take any of these medications: Glipizide/Metformin, Avandament, Glucavance, please do not take 48  hours after completing test unless otherwise instructed.  We will call to schedule your test 2-4 weeks out understanding that some insurance companies will need an authorization prior to the service being performed.   For non-scheduling related questions, please contact the cardiac imaging nurse navigator should you have any questions/concerns: Rockwell Alexandria, Cardiac Imaging Nurse Navigator Larey Brick, Cardiac Imaging Nurse Navigator [facility] Direct Office Dial: [phone]   For scheduling needs, including cancellations and rescheduling, please call [place], [phone].

## 2022-05-14 LAB — BASIC METABOLIC PANEL
BUN/Creatinine Ratio: 16 (ref 12–28)
BUN: 14 mg/dL (ref 8–27)
CO2: 22 mmol/L (ref 20–29)
Calcium: 9.6 mg/dL (ref 8.7–10.3)
Chloride: 103 mmol/L (ref 96–106)
Creatinine, Ser: 0.85 mg/dL (ref 0.57–1.00)
Glucose: 143 mg/dL — ABNORMAL HIGH (ref 70–99)
Potassium: 4.2 mmol/L (ref 3.5–5.2)
Sodium: 139 mmol/L (ref 134–144)
eGFR: 75 mL/min/{1.73_m2} (ref >59)

## 2022-05-18 ENCOUNTER — Telehealth (HOSPITAL_COMMUNITY): Payer: Self-pay | Admitting: *Deleted

## 2022-05-18 NOTE — Telephone Encounter (Signed)
Reaching out to patient to offer assistance regarding upcoming cardiac imaging study; pt verbalizes understanding of appt date/time, parking situation and where to check in, medications ordered, and verified current allergies; name and call back number provided for further questions should they arise  Larey Brick RN Navigator Cardiac Imaging [facility] [phone] office [phone] cell  Patient got an HR of 59 off her home BP cuff while on the phone. Asked patient to only take the 50mg  metoprolol if her was greater than 65bpm TWO hours prior to her cardiac CT scan. She is aware to arrive at 12:30pm.

## 2022-05-21 ENCOUNTER — Ambulatory Visit (HOSPITAL_COMMUNITY)
Admission: RE | Admit: 2022-05-21 | Discharge: 2022-05-21 | Disposition: A | Discharge status: Home / Self Care | Payer: Medicare Other | Source: Ambulatory Visit | Attending: Internal Medicine | Admitting: Internal Medicine

## 2022-05-21 DIAGNOSIS — R0602 Shortness of breath: Secondary | ICD-10-CM | POA: Insufficient documentation

## 2022-05-21 DIAGNOSIS — I2510 Atherosclerotic heart disease of native coronary artery without angina pectoris: Secondary | ICD-10-CM

## 2022-05-21 DIAGNOSIS — Z8249 Family history of ischemic heart disease and other diseases of the circulatory system: Secondary | ICD-10-CM | POA: Diagnosis present

## 2022-05-21 DIAGNOSIS — R0789 Other chest pain: Secondary | ICD-10-CM | POA: Diagnosis present

## 2022-05-21 DIAGNOSIS — R072 Precordial pain: Secondary | ICD-10-CM | POA: Insufficient documentation

## 2022-05-21 MED ORDER — NITROGLYCERIN 0.4 MG SL SUBL: 0.8000 mg | SUBLINGUAL_TABLET | Freq: Once | SUBLINGUAL | Status: AC

## 2022-05-21 MED ORDER — NITROGLYCERIN 0.4 MG SL SUBL
SUBLINGUAL_TABLET | SUBLINGUAL | Status: AC
  Administered 2022-05-21: 0.8 mg via SUBLINGUAL
  Filled 2022-05-21: qty 2

## 2022-05-21 MED ORDER — IOHEXOL 350 MG/ML SOLN
100.0000 mL | Freq: Once | INTRAVENOUS | Status: AC | PRN
  Administered 2022-05-21: 100 mL via INTRAVENOUS

## 2022-05-29 ENCOUNTER — Other Ambulatory Visit: Payer: Self-pay | Admitting: *Deleted

## 2022-05-29 DIAGNOSIS — E785 Hyperlipidemia, unspecified: Secondary | ICD-10-CM

## 2022-05-29 MED ORDER — ROSUVASTATIN CALCIUM 5 MG PO TABS: 5.0000 mg | ORAL_TABLET | Freq: Every day | ORAL | 3 refills | Status: DC

## 2022-06-27 ENCOUNTER — Telehealth: Payer: Self-pay | Admitting: Internal Medicine

## 2022-06-27 NOTE — Telephone Encounter (Signed)
Left message on personal phone that she is to keep her appointment with Dr. Debara Pickett and there are no new lab orders at this time. To call back with any questions or concerns.

## 2022-06-27 NOTE — Telephone Encounter (Signed)
Pt has an appt scheduled for 07/02/22 and wants to know if she still needs it and if she needs to have labs before or after

## 2022-07-02 ENCOUNTER — Ambulatory Visit: Payer: Medicare Other | Admitting: Internal Medicine

## 2022-08-04 LAB — NMR, LIPOPROFILE
Cholesterol, Total: 175 mg/dL (ref 100–199)
HDL Particle Number: 38.7 umol/L (ref >=30.5)
HDL-C: 53 mg/dL (ref >39)
LDL Particle Number: 1088 nmol/L — ABNORMAL HIGH (ref <1000)
LDL Size: 21.6 nm (ref >20.5)
LDL-C (NIH Calc): 97 mg/dL (ref 0–99)
LP-IR Score: 50 — ABNORMAL HIGH (ref <=45)
Small LDL Particle Number: 439 nmol/L (ref <=527)
Triglycerides: 142 mg/dL (ref 0–149)

## 2022-08-04 LAB — LIPOPROTEIN A (LPA): Lipoprotein (a): 199.6 nmol/L — ABNORMAL HIGH (ref <75.0)

## 2022-08-06 ENCOUNTER — Ambulatory Visit: Payer: Medicare Other | Attending: Internal Medicine | Admitting: Internal Medicine

## 2022-08-06 ENCOUNTER — Encounter: Payer: Self-pay | Admitting: Internal Medicine

## 2022-08-06 VITALS — BP 132/76 | HR 87 | Ht 65.0 in | Wt 175.0 lb

## 2022-08-06 DIAGNOSIS — R079 Chest pain, unspecified: Secondary | ICD-10-CM | POA: Diagnosis not present

## 2022-08-06 DIAGNOSIS — R0609 Other forms of dyspnea: Secondary | ICD-10-CM | POA: Diagnosis not present

## 2022-08-06 DIAGNOSIS — E785 Hyperlipidemia, unspecified: Secondary | ICD-10-CM | POA: Diagnosis not present

## 2022-08-06 DIAGNOSIS — Z8249 Family history of ischemic heart disease and other diseases of the circulatory system: Secondary | ICD-10-CM | POA: Insufficient documentation

## 2022-08-06 DIAGNOSIS — E7841 Elevated Lipoprotein(a): Secondary | ICD-10-CM | POA: Insufficient documentation

## 2022-08-06 NOTE — Progress Notes (Signed)
OFFICE CONSULT NOTE  Chief Complaint:  Follow-up chest pain/dyspnea  Primary Care Physician: Rolm Gala, MD  HPI:  [name] is a 66 y.o. female who is being seen today for the evaluation of chest pain/dyspnea at the request of Rolm Gala, MD. this is a pleasant 66 year old female kindly referred for evaluation and management of shortness of breath with exertion and chest tightness.  She has a history of type III Ehlers-Danlos syndrome which was diagnosed and followed at [facility].  She is also had numerous orthopedic issues including hip dislocation and recent trimalleolar fracture on the right ankle requiring long-term casting.  She also has a history of dyslipidemia.  Recent labs showed total cholesterol 241, triglycerides 154, HDL 58 and LDL 152, not on statin therapy.  She reports family history of heart disease including her mother who died of heart disease and had three-vessel CABG in her 22s.  Her father also had coronary artery disease.  She has been more sedentary due to her right ankle fracture.  EKG today shows normal sinus rhythm with sinus arrhythmia and nonspecific ST and T wave changes.  She also reports some occasional chest fluttering.  Her shortness of breath seems to be associated with exertion and improved by rest.  Her PCP ordered recent echocardiogram which demonstrated LVEF 60 to 65%, mild LVH and grade 1 diastolic dysfunction, normal RVSP, mild mitral regurgitation but otherwise no abnormalities.  The aorta was not dilated.  08/06/2022  [name] is seen today in follow-up.  She underwent CT coronary angiography which was reassuring that showing no coronary calcium although she did have some mild noncalcified plaque.  He also has known aortic atherosclerosis.  No significant extracardiac findings were noted.  Her lipids on low-dose rosuvastatin which has been well-tolerated have improved.  She is not quite at goal but her particle number is elevated at 1088,  LDL-C was 97, HDL-C of 53, and triglycerides were 142.  Small LDL particle number is low at 439 nmol/L.  Her LP(a) was elevated at 199.6 nmol/L.  PMHx:  Past Medical History:  Diagnosis Date   Chronic back pain    EDS (Ehlers-Danlos syndrome)     Past Surgical History:  Procedure Laterality Date   ANKLE SURGERY Right    APPENDECTOMY     JOINT REPLACEMENT     4 right hip replacements and 1 left hip replacement   MASTECTOMY Right    MENISCUS REPAIR Bilateral    NECK SURGERY     PARTIAL HYSTERECTOMY     SHOULDER SURGERY     x2    FAMHx:  Family History  Problem Relation Age of Onset   COPD Mother    Diabetes Mother    Scleroderma Mother    Diabetes Father    Alzheimer's disease Father    COPD Father    Ehlers-Danlos syndrome Daughter     SOCHx:   reports that she quit smoking about 23 years ago. Her smoking use included cigarettes. She smoked an average of 1 pack per day. She has never used smokeless tobacco. She reports that she does not drink alcohol and does not use drugs.  ALLERGIES:  Allergies  Allergen Reactions   Escitalopram Oxalate Shortness Of Breath   Platinum-Containing Compounds Rash    Patient states she has an allergy to palladium which is a component of the platinum group.   Cobalt Rash   Hydrochlorothiazide Rash    Significant leg swelling   Nickel Rash  Petrolatum    Tape Other (See Comments)    Skin comes off and rash Tegaderm ok   Bacitracin Rash    allergic contact dermatitis   Gold Rash    allergic contact dermatitis   Neomycin Rash    Allergic contact dermatitis   Pneumococcal Vaccines Swelling and Rash   Sulfonamide Derivatives Rash    ROS: Pertinent items noted in HPI and remainder of comprehensive ROS otherwise negative.  HOME MEDS: Current Outpatient Medications on File Prior to Visit  Medication Sig Dispense Refill   albuterol (VENTOLIN HFA) 108 (90 Base) MCG/ACT inhaler Inhale into the lungs as needed.     ascorbic acid  (VITAMIN C) 500 MG tablet Take by mouth.     aspirin EC 325 MG tablet Take by mouth.     cetirizine (ZYRTEC) 10 MG tablet Take 10 mg by mouth daily.     Cholecalciferol (VITAMIN D) 2000 UNITS tablet Take 2,000 Units by mouth 3 (three) times a week.      dextroamphetamine (DEXTROSTAT) 10 MG tablet Take 5 mg by mouth 2 (two) times daily with breakfast and lunch.   0   fluocinonide (LIDEX) 0.05 % external solution Apply topically.     methocarbamol (ROBAXIN) 500 MG tablet Take by mouth as needed.     nitroGLYCERIN (NITROSTAT) 0.4 MG SL tablet Place 0.4 mg under the tongue every 5 (five) minutes as needed for chest pain.     oxyCODONE (OXY IR/ROXICODONE) 5 MG immediate release tablet Take by mouth.     polyethylene glycol powder (GLYCOLAX/MIRALAX) 17 GM/SCOOP powder Take by mouth.     rosuvastatin (CRESTOR) 5 MG tablet Take 1 tablet (5 mg total) by mouth daily. 90 tablet 3   trazodone (DESYREL) 300 MG tablet Take 300 mg by mouth at bedtime.  2   No current facility-administered medications on file prior to visit.    LABS/IMAGING: No results found for this or any previous visit (from the past 48 hour(s)). No results found.  LIPID PANEL: No results found for: "CHOL", "TRIG", "HDL", "CHOLHDL", "VLDL", "LDLCALC", "LDLDIRECT"  WEIGHTS: Wt Readings from Last 3 Encounters:  08/06/22 175 lb (79.4 kg)  05/02/22 183 lb (83 kg)  02/20/21 180 lb (81.6 kg)    VITALS: BP 132/76 (BP Location: Left Arm, Patient Position: Sitting, Cuff Size: Large)   Pulse 87   Ht 5\' 5"  (1.651 m)   Wt 175 lb (79.4 kg)   SpO2 97%   BMI 29.12 kg/m   EXAM: Deferred  EKG: Deferred  ASSESSMENT: Progressive dyspnea on exertion/chest tightness, possible angina-0 coronary calcium and minimal nonobstructive coronary artery disease (07/2022-CT coronary angiography) Family history of premature coronary disease in her mother Dyslipidemia Type III Ehlers-Danlos syndrome Elevated LP(a) at 199.6 nmol/L  PLAN: 1.    [name] was fortunately found to have very minimal noncalcified coronary disease on her CT, but does have an elevated LP(a) which is significant.  She is on rosuvastatin which is her max tolerated dose of 5 mg but is still not at goal LDL cholesterol.  I think she would benefit from additional therapy.  We discussed options and she may be a good candidate for Leqvio.  Her insurance plan would likely cover this and it is a good option to add to her statin for further lipid lowering and some benefit in lowering LP(a).  We will reach out for prior authorization for this.  She should have periods drawn about 2 months after her second injection.  Plan  follow-up with me after that.  Chrystie Nose, MD, [facility], FACP  [place]  [facility]  Medical Director of the Advanced Lipid Disorders &  Cardiovascular Risk Reduction Clinic Diplomate of the American Board of Clinical Lipidology Attending Cardiologist  Direct Dial: [phone]  Fax: [phone]  Website:  [email or web address] Nicely Hennessy Bartel 08/06/2022, 3:42 PM

## 2022-08-06 NOTE — Patient Instructions (Signed)
Medication Instructions:  SOMEONE WILL REACH OUT TO YOU REGARDING LEQVIO MEDICATION  *If you need a refill on your cardiac medications before your next appointment, please call your pharmacy*  Lab Work: None Ordered At This Time.  If you have labs (blood work) drawn today and your tests are completely normal, you will receive your results only by: MyChart Message (if you have MyChart) OR A paper copy in the mail If you have any lab test that is abnormal or we need to change your treatment, we will call you to review the results.  Testing/Procedures: None Ordered At This Time.   Follow-Up: At [facility], you and your health needs are our priority.  As part of our continuing mission to provide you with exceptional heart care, we have created designated Provider Care Teams.  These Care Teams include your primary Cardiologist (physician) and Advanced Practice Providers (APPs -  Physician Assistants and Nurse Practitioners) who all work together to provide you with the care you need, when you need it.  Your next appointment:   6 month(s)  The format for your next appointment:   In Person  Provider:   Dr. Rennis Golden

## 2022-08-10 ENCOUNTER — Telehealth: Payer: Self-pay | Admitting: Internal Medicine

## 2022-08-10 NOTE — Telephone Encounter (Signed)
Pt is returning call.  

## 2022-08-10 NOTE — Telephone Encounter (Signed)
Spoke with patient of Dr. Rennis Golden - seen 12/4 She would like to proceed with Leqvio benefits check  Explained that her insurance has optimal coverage but we will need official statement of benefits to ensure all details Explained she will get an email to sign off on later today  Reviewed dosing schedule and advised where injections are given - infusion clinic

## 2022-08-10 NOTE — Telephone Encounter (Signed)
Attempted to call patient, left message for patient to call back to office.   

## 2022-08-10 NOTE — Telephone Encounter (Signed)
Pt calling for lab results, she states she read the results she just wants to know what her next steps are with the injection medication

## 2022-08-10 NOTE — Telephone Encounter (Signed)
Returned call to patient who states that she was just following up on process for Leqvio. Advised patient I would forward message to our pharmacy to check on this. Patient aware and verbalized understanding.

## 2022-08-14 NOTE — Telephone Encounter (Signed)
Faxed in leqvio start form as patient was unable to sing off via electronic attestation

## 2022-08-16 ENCOUNTER — Encounter: Payer: Self-pay | Admitting: Internal Medicine

## 2022-08-16 NOTE — Addendum Note (Signed)
Addended by: Lindell Spar on: 08/16/2022 01:00 PM   Modules accepted: Orders

## 2022-08-16 NOTE — Telephone Encounter (Signed)
Patient has been identified as candidate for Leqvio  Benefits investigation enrollment completed on 08/14/22  Benefits investigation report notes the following:  Type of insurance: medicare part B and [place] Max: none / Met: n/a  Deductible: $226  Co-insurance: 20%  PA required: NO  PA phone number: n/a  Benefits Summary Details:  Primary - Medicare part B covers Leqvio at 80% after $226 calendar year deductible, with 20% coinsurance, no PA Secondary/Supplement - AARP plan G covers 20% coinsurance but NOT $226 deductible  As of 08/14/22 - patient has met deductible

## 2022-08-20 ENCOUNTER — Telehealth: Payer: Self-pay | Admitting: Pharmacy Technician

## 2022-08-20 NOTE — Telephone Encounter (Signed)
Auth Submission: NO AUTH NEEDED Payer: Medicare a/b and UHC/AARP Medication & CPT/J Code(s) submitted: Leqvio (Inclisiran) J1306 Route of submission (phone, fax, portal):  Phone # Fax # Auth type: Buy/Bill Units/visits requested: X2 Reference number: [facility] BIV Approval from: 08/20/22 to 08/21/23   Patient will be scheduled as soon as possible.  Medicare will cover 80% and AAPR/UHC will pick-up remaining 20%.  Patient has met $226 medicare deductible.

## 2022-08-21 NOTE — Telephone Encounter (Signed)
First injection appointment is 08/31/22

## 2022-08-31 ENCOUNTER — Ambulatory Visit (INDEPENDENT_AMBULATORY_CARE_PROVIDER_SITE_OTHER): Payer: Medicare Other

## 2022-08-31 VITALS — BP 139/83 | HR 65 | Temp 98.1°F | Resp 18 | Ht 65.0 in | Wt 174.2 lb

## 2022-08-31 DIAGNOSIS — E782 Mixed hyperlipidemia: Secondary | ICD-10-CM | POA: Diagnosis not present

## 2022-08-31 MED ORDER — INCLISIRAN SODIUM 284 MG/1.5ML ~~LOC~~ SOSY
284.0000 mg | PREFILLED_SYRINGE | Freq: Once | SUBCUTANEOUS | Status: AC
  Administered 2022-08-31: 284 mg via SUBCUTANEOUS

## 2022-08-31 NOTE — Progress Notes (Cosign Needed)
Diagnosis: Hyperlipidemia  Provider:  Praveen Mannam MD  Procedure: Injection  Leqvio (inclisiran), Dose: 284 mg, Site: subcutaneous, Number of injections: 1  Post Care: Observation period completed  Discharge: Condition: Good, Destination: Home . AVS provided to patient.   Performed by:  Darryel Diodato, RN       

## 2022-12-05 ENCOUNTER — Ambulatory Visit (INDEPENDENT_AMBULATORY_CARE_PROVIDER_SITE_OTHER): Payer: Medicare Other

## 2022-12-05 VITALS — BP 131/77 | HR 59 | Temp 98.3°F | Resp 18 | Ht 65.0 in | Wt 168.2 lb

## 2022-12-05 DIAGNOSIS — E782 Mixed hyperlipidemia: Secondary | ICD-10-CM | POA: Diagnosis not present

## 2022-12-05 MED ORDER — INCLISIRAN SODIUM 284 MG/1.5ML ~~LOC~~ SOSY
284.0000 mg | PREFILLED_SYRINGE | Freq: Once | SUBCUTANEOUS | Status: AC
  Administered 2022-12-05: 284 mg via SUBCUTANEOUS
  Filled 2022-12-05: qty 1.5

## 2022-12-05 NOTE — Progress Notes (Signed)
Diagnosis: Hyperlipidemia  Provider:  Marshell Garfinkel MD  Procedure: Infusion  Leqvio (inclisiran), Dose: 284 mg, Site: subcutaneous, Number of injections: 1  Post Care: Patient declined observation  Discharge: Condition: Good, Destination: Home . AVS Declined  Performed by:  Cleophus Molt, RN

## 2023-02-08 ENCOUNTER — Encounter: Payer: Self-pay | Admitting: Internal Medicine

## 2023-02-08 ENCOUNTER — Ambulatory Visit: Payer: Medicare Other | Attending: Internal Medicine | Admitting: Internal Medicine

## 2023-02-08 VITALS — BP 132/78 | HR 68 | Ht 65.0 in | Wt 163.2 lb

## 2023-02-08 DIAGNOSIS — R0789 Other chest pain: Secondary | ICD-10-CM | POA: Diagnosis present

## 2023-02-08 DIAGNOSIS — R002 Palpitations: Secondary | ICD-10-CM | POA: Diagnosis present

## 2023-02-08 DIAGNOSIS — E785 Hyperlipidemia, unspecified: Secondary | ICD-10-CM | POA: Diagnosis present

## 2023-02-08 DIAGNOSIS — E7841 Elevated Lipoprotein(a): Secondary | ICD-10-CM | POA: Diagnosis present

## 2023-02-08 DIAGNOSIS — R072 Precordial pain: Secondary | ICD-10-CM | POA: Diagnosis present

## 2023-02-08 NOTE — Patient Instructions (Signed)
Medication Instructions:  No changes *If you need a refill on your cardiac medications before your next appointment, please call your pharmacy*   Lab Work: Lipid NMR, LPa- today If you have labs (blood work) drawn today and your tests are completely normal, you will receive your results only by: MyChart Message (if you have MyChart) OR A paper copy in the mail If you have any lab test that is abnormal or we need to change your treatment, we will call you to review the results.  Follow-Up: At [facility], you and your health needs are our priority.  As part of our continuing mission to provide you with exceptional heart care, we have created designated Provider Care Teams.  These Care Teams include your primary Cardiologist (physician) and Advanced Practice Providers (APPs -  Physician Assistants and Nurse Practitioners) who all work together to provide you with the care you need, when you need it.  We recommend signing up for the patient portal called "MyChart".  Sign up information is provided on this After Visit Summary.  MyChart is used to connect with patients for Virtual Visits (Telemedicine).  Patients are able to view lab/test results, encounter notes, upcoming appointments, etc.  Non-urgent messages can be sent to your provider as well.   To learn more about what you can do with MyChart, go to [email or web address].    Your next appointment:   6 month(s)  Provider:   Dr Rennis Golden

## 2023-02-08 NOTE — Progress Notes (Signed)
OFFICE CONSULT NOTE  Chief Complaint:  Follow-up  Primary Care Physician: Rolm Gala, MD  HPI:  [name] is a 67 y.o. female who is being seen today for the evaluation of chest pain/dyspnea at the request of Rolm Gala, MD. this is a pleasant 67 year old female kindly referred for evaluation and management of shortness of breath with exertion and chest tightness.  She has a history of type III Ehlers-Danlos syndrome which was diagnosed and followed at [facility].  She is also had numerous orthopedic issues including hip dislocation and recent trimalleolar fracture on the right ankle requiring long-term casting.  She also has a history of dyslipidemia.  Recent labs showed total cholesterol 241, triglycerides 154, HDL 58 and LDL 152, not on statin therapy.  She reports family history of heart disease including her mother who died of heart disease and had three-vessel CABG in her 28s.  Her father also had coronary artery disease.  She has been more sedentary due to her right ankle fracture.  EKG today shows normal sinus rhythm with sinus arrhythmia and nonspecific ST and T wave changes.  She also reports some occasional chest fluttering.  Her shortness of breath seems to be associated with exertion and improved by rest.  Her PCP ordered recent echocardiogram which demonstrated LVEF 60 to 65%, mild LVH and grade 1 diastolic dysfunction, normal RVSP, mild mitral regurgitation but otherwise no abnormalities.  The aorta was not dilated.  08/06/2022  [name] is seen today in follow-up.  She underwent CT coronary angiography which was reassuring that showing no coronary calcium although she did have some mild noncalcified plaque.  He also has known aortic atherosclerosis.  No significant extracardiac findings were noted.  Her lipids on low-dose rosuvastatin which has been well-tolerated have improved.  She is not quite at goal but her particle number is elevated at 1088, LDL-C was 97, HDL-C of  53, and triglycerides were 142.  Small LDL particle number is low at 439 nmol/L.  Her LP(a) was elevated at 199.6 nmol/L.  02/08/2023  [name] is seen today in follow-up.  She reports her chest pain and palpitations have resolved.  EKG today is normal.  CT coronary angiography showed no coronary calcium but some mild noncalcified plaque.  She has also lost a significant amount of weight since I last saw her which may be helping her.  She feels a lot better and I think her lipids will be better as well.  She has been on Leqvio and has had 2 injections with 1/3 injection scheduled in October.  She has not had repeat lipids since then.  PMHx:  Past Medical History:  Diagnosis Date   Chronic back pain    EDS (Ehlers-Danlos syndrome)     Past Surgical History:  Procedure Laterality Date   ANKLE SURGERY Right    APPENDECTOMY     JOINT REPLACEMENT     4 right hip replacements and 1 left hip replacement   MASTECTOMY Right    MENISCUS REPAIR Bilateral    NECK SURGERY     PARTIAL HYSTERECTOMY     SHOULDER SURGERY     x2    FAMHx:  Family History  Problem Relation Age of Onset   COPD Mother    Diabetes Mother    Scleroderma Mother    Diabetes Father    Alzheimer's disease Father    COPD Father    Ehlers-Danlos syndrome Daughter     SOCHx:   reports that  she quit smoking about 24 years ago. Her smoking use included cigarettes. She smoked an average of 1 pack per day. She has never used smokeless tobacco. She reports that she does not drink alcohol and does not use drugs.  ALLERGIES:  Allergies  Allergen Reactions   Escitalopram Oxalate Shortness Of Breath   Platinum-Containing Compounds Rash    Patient states she has an allergy to palladium which is a component of the platinum group.   Cobalt Rash   Hydrochlorothiazide Rash    Significant leg swelling   Nickel Rash   Petrolatum    Tape Other (See Comments)    Skin comes off and rash Tegaderm ok   Bacitracin Rash     allergic contact dermatitis   Gold Rash    allergic contact dermatitis   Neomycin Rash    Allergic contact dermatitis   Pneumococcal Vaccines Swelling and Rash   Sulfonamide Derivatives Rash    ROS: Pertinent items noted in HPI and remainder of comprehensive ROS otherwise negative.  HOME MEDS: Current Outpatient Medications on File Prior to Visit  Medication Sig Dispense Refill   albuterol (VENTOLIN HFA) 108 (90 Base) MCG/ACT inhaler Inhale into the lungs as needed.     aspirin EC 325 MG tablet Take by mouth.     cetirizine (ZYRTEC) 10 MG tablet Take 10 mg by mouth daily.     dextroamphetamine (DEXTROSTAT) 10 MG tablet Take 5 mg by mouth 2 (two) times daily with breakfast and lunch.   0   fluocinonide (LIDEX) 0.05 % external solution Apply topically.     gabapentin (NEURONTIN) 100 MG capsule Take 100 mg by mouth as needed.     inclisiran (LEQVIO) 284 MG/1.5ML SOSY injection Inject 284 mg into the skin once. Once every 6 months     Melatonin 10 MG TABS Take 10 mg by mouth at bedtime.     methocarbamol (ROBAXIN) 500 MG tablet Take by mouth as needed.     mupirocin ointment (BACTROBAN) 2 % Apply 1 Application topically as needed.     nitroGLYCERIN (NITROSTAT) 0.4 MG SL tablet Place 0.4 mg under the tongue every 5 (five) minutes as needed for chest pain.     oxyCODONE (OXY IR/ROXICODONE) 5 MG immediate release tablet Take by mouth.     polyethylene glycol powder (GLYCOLAX/MIRALAX) 17 GM/SCOOP powder Take by mouth.     rosuvastatin (CRESTOR) 5 MG tablet Take 1 tablet (5 mg total) by mouth daily. 90 tablet 3   trazodone (DESYREL) 300 MG tablet Take 300 mg by mouth at bedtime.  2   ascorbic acid (VITAMIN C) 500 MG tablet Take by mouth. (Patient not taking: Reported on 02/08/2023)     Cholecalciferol (VITAMIN D) 2000 UNITS tablet Take 2,000 Units by mouth 3 (three) times a week.  (Patient not taking: Reported on 02/08/2023)     No current facility-administered medications on file prior to visit.     LABS/IMAGING: No results found for this or any previous visit (from the past 48 hour(s)). No results found.  LIPID PANEL: No results found for: "CHOL", "TRIG", "HDL", "CHOLHDL", "VLDL", "LDLCALC", "LDLDIRECT"  WEIGHTS: Wt Readings from Last 3 Encounters:  02/08/23 163 lb 3.2 oz (74 kg)  12/05/22 168 lb 3.2 oz (76.3 kg)  08/31/22 174 lb 3.2 oz (79 kg)    VITALS: BP 132/78 (BP Location: Left Arm, Patient Position: Sitting, Cuff Size: Normal)   Pulse 68   Ht 5\' 5"  (1.651 m)   Wt 163 lb 3.2  oz (74 kg)   SpO2 97%   BMI 27.16 kg/m   EXAM: Deferred  EKG: Sinus rhythm at 67-personally reviewed  ASSESSMENT: Progressive dyspnea on exertion/chest tightness, possible angina-0 coronary calcium and minimal nonobstructive coronary artery disease (07/2022-CT coronary angiography) Family history of premature coronary disease in her mother Dyslipidemia Type III Ehlers-Danlos syndrome Elevated LP(a) at 199.6 nmol/L  PLAN: 1.   [name] reports that her chest tightness and palpitations have resolved.  EKG is normal today.  She is tolerating Leqvio and has had 2 injections and at this point should have a significantly reduced lipid profile.  Will check an NMR and LP(a) today.  Plan otherwise follow-up with me in 6 months or sooner as necessary.  Chrystie Nose, MD, [facility], FACP  [place]  [facility]  Medical Director of the Advanced Lipid Disorders &  Cardiovascular Risk Reduction Clinic Diplomate of the American Board of Clinical Lipidology Attending Cardiologist  Direct Dial: [phone]  Fax: [phone]  Website:  [email or web address]  Chrystie Nose 02/08/2023, 9:19 AM

## 2023-02-09 LAB — NMR, LIPOPROFILE
HDL Particle Number: 40.3 umol/L (ref >=30.5)
LDL Size: 21.7 nm (ref >20.5)
Small LDL Particle Number: 356 nmol/L (ref <=527)
Triglycerides: 163 mg/dL — ABNORMAL HIGH (ref 0–149)

## 2023-02-13 LAB — NMR, LIPOPROFILE
Cholesterol, Total: 168 mg/dL (ref 100–199)
HDL-C: 63 mg/dL (ref >39)
LDL Particle Number: 839 nmol/L (ref <1000)
LDL-C (NIH Calc): 78 mg/dL (ref 0–99)
LP-IR Score: 42 (ref <=45)

## 2023-02-13 LAB — LIPOPROTEIN A (LPA): Lipoprotein (a): 171.7 nmol/L — ABNORMAL HIGH (ref <75.0)

## 2023-05-23 ENCOUNTER — Other Ambulatory Visit: Payer: Self-pay | Admitting: Internal Medicine

## 2023-06-06 ENCOUNTER — Ambulatory Visit: Payer: Medicare Other

## 2023-06-06 VITALS — BP 147/73 | HR 54 | Temp 98.1°F | Resp 18 | Ht 65.0 in | Wt 161.5 lb

## 2023-06-06 DIAGNOSIS — E782 Mixed hyperlipidemia: Secondary | ICD-10-CM

## 2023-06-06 MED ORDER — INCLISIRAN SODIUM 284 MG/1.5ML ~~LOC~~ SOSY
284.0000 mg | PREFILLED_SYRINGE | Freq: Once | SUBCUTANEOUS | Status: AC
  Administered 2023-06-06: 284 mg via SUBCUTANEOUS
  Filled 2023-06-06: qty 1.5

## 2023-06-06 NOTE — Progress Notes (Signed)
Diagnosis: Hyperlipidemia  Provider:  Chilton Greathouse MD  Procedure: Injection  Leqvio (inclisiran), Dose: 284 mg, Site: subcutaneous, Number of injections: 1  Post Care: Patient declined observation  Discharge: Condition: Good, Destination: Home . AVS Declined  Performed by:  Marlow Baars [place], RN

## 2023-07-24 ENCOUNTER — Other Ambulatory Visit: Payer: Self-pay | Admitting: Internal Medicine

## 2023-07-24 DIAGNOSIS — E785 Hyperlipidemia, unspecified: Secondary | ICD-10-CM

## 2023-08-05 ENCOUNTER — Ambulatory Visit (INDEPENDENT_AMBULATORY_CARE_PROVIDER_SITE_OTHER): Payer: Medicare Other | Admitting: Internal Medicine

## 2023-08-05 VITALS — BP 150/96 | HR 74 | Resp 16 | Ht 65.0 in | Wt 167.0 lb

## 2023-08-05 DIAGNOSIS — R030 Elevated blood-pressure reading, without diagnosis of hypertension: Secondary | ICD-10-CM

## 2023-08-05 DIAGNOSIS — R0683 Snoring: Secondary | ICD-10-CM | POA: Diagnosis not present

## 2023-08-05 NOTE — Progress Notes (Signed)
Sleep Medicine   Office Visit  Patient Name: [name] DOB: 12-04-1955 MRN [id number]    Chief Complaint: concern for sleep apnea   Brief History:  Bhavya presents for an initial consult for sleep evaluation and to establish care. Patient has a longstanding history of snoring. Sleep quality is fair. This is noted most nights. The patient's bed partner reports  snoring and gasping at night. The patient relates the following symptoms: some headaches, trouble concentrating, brain fogginess, and excessive fatigue are also present. The patient goes to sleep at 1030 pm and wakes up at 0800 am and will wake up at least once in between and will have difficulty returning to sleep. Sleep quality is worse when outside home environment.  Patient has noted significant movement of her legs at night that would disrupt her sleep.  The patient  relates very vivid dreams as unusual behavior during the night.  The patient relates ADD, PTSD, depression, anxiety as a history of psychiatric problems. The Epworth Sleepiness Score is 6 out of 24 .  The patient relates  Cardiovascular risk factors include: elevated blood pressure.    ROS  General: (-) fever, (-) chills, (-) night sweat Nose and Sinuses: (-) nasal stuffiness or itchiness, (-) postnasal drip, (-) nosebleeds, (-) sinus trouble. Mouth and Throat: (-) sore throat, (-) hoarseness. Neck: (-) swollen glands, (-) enlarged thyroid, (-) neck pain. Respiratory: - cough, + shortness of breath, - wheezing. Neurologic: -+numbness, - tingling. Psychiatric: + anxiety, + depression Sleep behavior: -sleep paralysis -hypnogogic hallucinations -dream enactment      -vivid dreams -cataplexy -night terrors -sleep walking   Current Medication: Outpatient Encounter Medications as of 08/05/2023  Medication Sig   albuterol (VENTOLIN HFA) 108 (90 Base) MCG/ACT inhaler Inhale into the lungs as needed.   ascorbic acid (VITAMIN C) 500 MG tablet Take by mouth.    cetirizine (ZYRTEC) 10 MG tablet Take 10 mg by mouth daily.   Cholecalciferol (VITAMIN D) 2000 UNITS tablet Take 2,000 Units by mouth 3 (three) times a week.    dextroamphetamine (DEXTROSTAT) 10 MG tablet Take 5 mg by mouth 2 (two) times daily with breakfast and lunch.    fluocinonide (LIDEX) 0.05 % external solution Apply topically.   gabapentin (NEURONTIN) 100 MG capsule Take 100 mg by mouth as needed.   ibuprofen (ADVIL) 600 MG tablet Take 600 mg by mouth every 6 (six) hours as needed.   inclisiran (LEQVIO) 284 MG/1.5ML SOSY injection Inject 284 mg into the skin once. Once every 6 months   Melatonin 10 MG TABS Take 10 mg by mouth at bedtime.   methocarbamol (ROBAXIN) 500 MG tablet Take by mouth as needed.   mupirocin ointment (BACTROBAN) 2 % Apply 1 Application topically as needed.   nitroGLYCERIN (NITROSTAT) 0.4 MG SL tablet Place 0.4 mg under the tongue every 5 (five) minutes as needed for chest pain.   oxyCODONE (OXY IR/ROXICODONE) 5 MG immediate release tablet Take by mouth.   pantoprazole (PROTONIX) 40 MG tablet Take 40 mg by mouth daily.   polyethylene glycol powder (GLYCOLAX/MIRALAX) 17 GM/SCOOP powder Take by mouth.   rosuvastatin (CRESTOR) 5 MG tablet TAKE 1 TABLET BY MOUTH DAILY   trazodone (DESYREL) 300 MG tablet Take 300 mg by mouth at bedtime.   No facility-administered encounter medications on file as of 08/05/2023.    Surgical History: Past Surgical History:  Procedure Laterality Date   ANKLE SURGERY Right    APPENDECTOMY     JOINT REPLACEMENT  4 right hip replacements and 1 left hip replacement   MASTECTOMY Right    MENISCUS REPAIR Bilateral    NECK SURGERY     PARTIAL HYSTERECTOMY     SHOULDER SURGERY     x2    Medical History: Past Medical History:  Diagnosis Date   Chronic back pain    EDS (Ehlers-Danlos syndrome)     Family History: Non contributory to the present illness  Social History: Social History   Socioeconomic History   Marital  status: Married    Spouse name: Not on file   Number of children: Not on file   Years of education: Not on file   Highest education level: Not on file  Occupational History   Not on file  Tobacco Use   Smoking status: Former    Current packs/day: 0.00    Types: Cigarettes    Quit date: 2000    Years since quitting: 68   Smokeless tobacco: Never  Vaping Use   Vaping status: Never Used  Substance and Sexual Activity   Alcohol use: No    Comment: rarely   Drug use: No   Sexual activity: Not on file  Other Topics Concern   Not on file  Social History Narrative   Not on file   Social Determinants of Health   Financial Resource Strain: Low Risk  (09/11/2019)   Received from [facility], [facility]   Overall Financial Resource Strain (CARDIA)    Difficulty of Paying Living Expenses: Not very hard  Food Insecurity: No Food Insecurity (10/03/2021)   Received from [facility], [facility]   Hunger Vital Sign    Worried About Running Out of Food in the Last Year: Never true    Ran Out of Food in the Last Year: Never true  Transportation Needs: No Transportation Needs (09/11/2019)   Received from [facility], [facility]   [facility] - Transportation    In the past 12 months, has lack of transportation kept you from medical appointments or from getting medications?: No    Lack of Transportation (Non-Medical): No  Physical Activity: Not on file  Stress: Not on file  Social Connections: Unknown (09/11/2019)   Received from [facility], [facility]   Social Connection and Isolation Panel [NHANES]    Frequency of Communication with Friends and Family: More than three times a week    Frequency of Social Gatherings with Friends and Family: Not on file    Attends Religious Services: Not on file    Active Member of Clubs or Organizations: No     Attends Banker Meetings: Never    Marital Status: Married  Catering manager Violence: Not on file    Vital Signs: Blood pressure (!) 150/96, pulse 74, resp. rate 16, height 5\' 5"  (1.651 m), weight 167 lb (75.8 kg), SpO2 97%. Body mass index is 27.79 kg/m.   Examination: General Appearance: The patient is well-developed, well-nourished, and in no distress. Neck Circumference: 38 cm Skin: Gross inspection of skin unremarkable. Head: normocephalic, no gross deformities. Eyes: no gross deformities noted. ENT: ears appear grossly normal Neurologic: Alert and oriented. No involuntary movements.    STOP BANG RISK ASSESSMENT S (snore) Have you been told that you snore?     YES   T (tired) Are you often tired, fatigued, or sleepy during the day?   YES  O (obstruction) Do  you stop breathing, choke, or gasp during sleep? YES   P (pressure) Do you have or are you being treated for high blood pressure? YES   B (BMI) Is your body index greater than 35 kg/m? NO   A (age) Are you 54 years old or older? YES   N (neck) Do you have a neck circumference greater than 16 inches?   NO   G (gender) Are you a female? NO   TOTAL STOP/BANG "YES" ANSWERS 5                                                               A STOP-Bang score of 2 or less is considered low risk, and a score of 5 or more is high risk for having either moderate or severe OSA. For people who score 3 or 4, doctors may need to perform further assessment to determine how likely they are to have OSA.         EPWORTH SLEEPINESS SCALE:  Scale:  (0)= no chance of dozing; (1)= slight chance of dozing; (2)= moderate chance of dozing; (3)= high chance of dozing  Chance  Situtation    Sitting and reading: 0    Watching TV: 1    Sitting Inactive in public: 0    As a passenger in car: 1      Lying down to rest: 2    Sitting and talking: 0    Sitting quielty after lunch: 2    In a car, stopped in traffic:  0   TOTAL SCORE:   6 out of 24    SLEEP STUDIES:  None   LABS: No results found for this or any previous visit (from the past 2160 hour(s)).  Radiology: CT CORONARY MORPH W/CTA COR W/SCORE W/CA W/CM &/OR WO/CM  Addendum Date: 05/22/2022   ADDENDUM REPORT: 05/22/2022 12:22 EXAM: OVER-READ INTERPRETATION  CT CHEST The following report is an over-read performed by radiologist Dr. Roque Lias [facility] on 05/22/2022. This over-read does not include interpretation of cardiac or coronary anatomy or pathology. The coronary CTA interpretation by the cardiologist is attached. COMPARISON:  None. FINDINGS: The visualized portions of the extracardiac vascular structures of the chest are unremarkable. Visualized mediastinum is unremarkable. Visualized portion of upper abdomen is unremarkable. Visualized pulmonary parenchyma is unremarkable. Visualized skeleton is unremarkable. IMPRESSION: No definite abnormality seen involving the visualized extracardiac structures of the chest. Electronically Signed   By: Lupita Raider M.D.   On: 05/22/2022 12:22   Result Date: 05/22/2022 CLINICAL DATA:  13F with chest pain EXAM: Cardiac/Coronary CTA TECHNIQUE: The patient was scanned on a [employer]. FINDINGS: A 100 kV prospective scan was triggered in the descending thoracic aorta at 111 HU's. Axial non-contrast 3 mm slices were carried out through the heart. The data set was analyzed on a dedicated work station and scored using the Agatson method. Gantry rotation speed was 250 msecs and collimation was .6 mm. No beta blockade and 0.8 mg of sl NTG was given. The 3D data set was reconstructed in 5% intervals of the 35-75 % of the R-R cycle. Phases were analyzed on a dedicated work station using MPR, MIP and VRT modes. The patient received 100 cc of contrast. Coronary Arteries:  Normal  coronary origin.  Right dominance. RCA is a large dominant artery that gives rise to PDA and PLA.  Noncalcified plaque in ostial RCA causes 0-24% stenosis. Noncalcified plaque in mid RCA causes 25-49% stenosis Left main is a large artery that gives rise to LAD and LCX arteries. LAD is a large vessel. Noncalcified plaque in mid LAD causes minimal (0-24%) stenosis LCX is a non-dominant artery.  There is no plaque. Other findings: Left Ventricle: Normal size Left Atrium: Normal size Pulmonary Veins: Normal configuration Right Ventricle: Normal size Right Atrium: Normal size Cardiac valves: No calcifications Thoracic aorta: Normal size Pulmonary Arteries: Normal size Systemic Veins: Normal drainage Pericardium: Normal thickness IMPRESSION: 1. Coronary calcium score of 0. 2. Normal coronary origin with right dominance. 3. Nonobstructive CAD. 4. Noncalcified plaque causes mild (24-59%) stenosis in mid RCA, and causes minimal (0-24%) stenosis in ostial RCA and proximal LAD CAD-RADS 2. Mild non-obstructive CAD (25-49%). Consider non-atherosclerotic causes of chest pain. Consider preventive therapy and risk factor modification. Electronically Signed: By: Epifanio Lesches M.D. On: 05/22/2022 12:05    No results found.  No results found.    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 Snoring Patient evaluation suggests high risk of sleep disordered breathing due to gasping, frequent awakening, snoring, daytime sleepiness, headaches.  Patient has comorbid cardiovascular risk factors including:  elevated blood pressure which could be exacerbated by pathologic sleep-disordered breathing.  Suggest: PSG to assess/treat the patient's sleep disordered breathing. The patient was also counselled on weight loss to optimize sleep health.  2. Elevated blood-pressure reading without diagnosis of hypertension Discussed, she will check her bp at home twice daily and report results to her doctor.  Hypertension Counseling:   The following hypertensive lifestyle modification were recommended and discussed:  1. Limiting alcohol intake to less than 1 oz/day of ethanol:(24 oz of beer or 8 oz of wine or 2 oz of 100-proof whiskey). 2. Take baby ASA 81 mg daily. 3. Importance of regular aerobic exercise and losing weight. 4. Reduce dietary saturated fat and cholesterol intake for overall cardiovascular health. 5. Maintaining adequate dietary potassium, calcium, and magnesium intake. 6. Regular monitoring of the blood pressure. 7. Reduce sodium intake to less than 100 mmol/day (less than 2.3 gm of sodium or less than 6 gm of sodium choride)       General Counseling: I have discussed the findings of the evaluation and examination with Kendal Hymen.  I have also discussed any further diagnostic evaluation thatmay be needed or ordered today. Kamile verbalizes understanding of the findings of todays visit. We also reviewed her medications today and discussed drug interactions and side effects including but not limited excessive drowsiness and altered mental states. We also discussed that there is always a risk not just to her but also people around her. she has been encouraged to call the office with any questions or concerns that should arise related to todays visit.  No orders of the defined types were placed in this encounter.       I  have personally obtained a history, evaluated the patient, evaluated pertinent data, formulated the assessment and plan and placed orders.   This patient was seen today by Emmaline Kluver, PA-C in collaboration with Dr. Freda Munro.   Yevonne Pax, MD [facility] Diplomate ABMS Pulmonary and Critical Care Medicine Sleep medicine

## 2023-08-07 ENCOUNTER — Encounter: Payer: Self-pay | Admitting: Internal Medicine

## 2023-08-09 LAB — NMR, LIPOPROFILE
Cholesterol, Total: 142 mg/dL (ref 100–199)
HDL Particle Number: 43.4 umol/L (ref >=30.5)
HDL-C: 70 mg/dL (ref >39)
LDL Particle Number: 579 nmol/L (ref <1000)
LDL Size: 21 nmol (ref >20.5)
LDL-C (NIH Calc): 51 mg/dL (ref 0–99)
LP-IR Score: 41 (ref <=45)
Small LDL Particle Number: 99 nmol/L (ref <=527)
Triglycerides: 123 mg/dL (ref 0–149)

## 2023-08-12 ENCOUNTER — Encounter: Payer: Self-pay | Admitting: Internal Medicine

## 2023-08-12 ENCOUNTER — Ambulatory Visit: Payer: Medicare Other | Attending: Internal Medicine | Admitting: Internal Medicine

## 2023-08-12 VITALS — BP 142/90 | HR 63 | Ht 65.0 in | Wt 169.0 lb

## 2023-08-12 DIAGNOSIS — R002 Palpitations: Secondary | ICD-10-CM | POA: Insufficient documentation

## 2023-08-12 DIAGNOSIS — E785 Hyperlipidemia, unspecified: Secondary | ICD-10-CM | POA: Insufficient documentation

## 2023-08-12 DIAGNOSIS — E7841 Elevated Lipoprotein(a): Secondary | ICD-10-CM | POA: Insufficient documentation

## 2023-08-12 NOTE — Progress Notes (Signed)
OFFICE CONSULT NOTE  Chief Complaint:  Follow-up  Primary Care Physician: Rolm Gala, MD  HPI:  [name] is a 67 y.o. female who is being seen today for the evaluation of chest pain/dyspnea at the request of Rolm Gala, MD. this is a pleasant 67 year old female kindly referred for evaluation and management of shortness of breath with exertion and chest tightness.  She has a history of type III Ehlers-Danlos syndrome which was diagnosed and followed at [facility].  She is also had numerous orthopedic issues including hip dislocation and recent trimalleolar fracture on the right ankle requiring long-term casting.  She also has a history of dyslipidemia.  Recent labs showed total cholesterol 241, triglycerides 154, HDL 58 and LDL 152, not on statin therapy.  She reports family history of heart disease including her mother who died of heart disease and had three-vessel CABG in her 33s.  Her father also had coronary artery disease.  She has been more sedentary due to her right ankle fracture.  EKG today shows normal sinus rhythm with sinus arrhythmia and nonspecific ST and T wave changes.  She also reports some occasional chest fluttering.  Her shortness of breath seems to be associated with exertion and improved by rest.  Her PCP ordered recent echocardiogram which demonstrated LVEF 60 to 65%, mild LVH and grade 1 diastolic dysfunction, normal RVSP, mild mitral regurgitation but otherwise no abnormalities.  The aorta was not dilated.  08/06/2022  [name] is seen today in follow-up.  She underwent CT coronary angiography which was reassuring that showing no coronary calcium although she did have some mild noncalcified plaque.  He also has known aortic atherosclerosis.  No significant extracardiac findings were noted.  Her lipids on low-dose rosuvastatin which has been well-tolerated have improved.  She is not quite at goal but her particle number is elevated at 1088, LDL-C was 97, HDL-C of  53, and triglycerides were 142.  Small LDL particle number is low at 439 nmol/L.  Her LP(a) was elevated at 199.6 nmol/L.  02/08/2023  [name] is seen today in follow-up.  She reports her chest pain and palpitations have resolved.  EKG today is normal.  CT coronary angiography showed no coronary calcium but some mild noncalcified plaque.  She has also lost a significant amount of weight since I last saw her which may be helping her.  She feels a lot better and I think her lipids will be better as well.  She has been on Leqvio and has had 2 injections with 1/3 injection scheduled in October.  She has not had repeat lipids since then.  08/12/2023  [name] is seen today in follow-up.  She continues to do well on Leqvio.  Recent lipids showed an LDL particle #574, LDL of 51, HDL of 70 and otherwise well treated lipids.  Her LP(a) is come down as well to 171 (down from 199 nmol/L).  PMHx:  Past Medical History:  Diagnosis Date   Chronic back pain    EDS (Ehlers-Danlos syndrome)     Past Surgical History:  Procedure Laterality Date   ANKLE SURGERY Right    APPENDECTOMY     JOINT REPLACEMENT     4 right hip replacements and 1 left hip replacement   MASTECTOMY Right    MENISCUS REPAIR Bilateral    NECK SURGERY     PARTIAL HYSTERECTOMY     SHOULDER SURGERY     x2    FAMHx:  Family History  Problem Relation Age of Onset   COPD Mother    Diabetes Mother    Scleroderma Mother    Diabetes Father    Alzheimer's disease Father    COPD Father    Ehlers-Danlos syndrome Daughter     SOCHx:   reports that she quit smoking about 24 years ago. Her smoking use included cigarettes. She has never used smokeless tobacco. She reports that she does not drink alcohol and does not use drugs.  ALLERGIES:  Allergies  Allergen Reactions   Escitalopram Oxalate Shortness Of Breath   Platinum-Containing Compounds Rash    Patient states she has an allergy to palladium which is a component of the  platinum group.   Cobalt Rash   Hydrochlorothiazide Rash    Significant leg swelling   Nickel Rash    Other Reaction(s): Other (See Comments)  "Constant inflammation"- per pt   Petrolatum    Tape Other (See Comments)    Skin comes off and rash Tegaderm ok   Bacitracin Rash    allergic contact dermatitis   Gold Rash    allergic contact dermatitis   Neomycin Rash    Allergic contact dermatitis   Pneumococcal Vaccines Swelling and Rash   Sulfonamide Derivatives Rash    ROS: Pertinent items noted in HPI and remainder of comprehensive ROS otherwise negative.  HOME MEDS: Current Outpatient Medications on File Prior to Visit  Medication Sig Dispense Refill   albuterol (VENTOLIN HFA) 108 (90 Base) MCG/ACT inhaler Inhale into the lungs as needed.     ascorbic acid (VITAMIN C) 500 MG tablet Take by mouth.     cetirizine (ZYRTEC) 10 MG tablet Take 10 mg by mouth daily.     Cholecalciferol (VITAMIN D) 2000 UNITS tablet Take 2,000 Units by mouth 3 (three) times a week.      dextroamphetamine (DEXTROSTAT) 10 MG tablet Take 5 mg by mouth 2 (two) times daily with breakfast and lunch.   0   fluocinonide (LIDEX) 0.05 % external solution Apply topically.     gabapentin (NEURONTIN) 100 MG capsule Take 100 mg by mouth as needed.     ibuprofen (ADVIL) 600 MG tablet Take 600 mg by mouth every 6 (six) hours as needed.     inclisiran (LEQVIO) 284 MG/1.5ML SOSY injection Inject 284 mg into the skin once. Once every 6 months     Melatonin 10 MG TABS Take 10 mg by mouth at bedtime.     methocarbamol (ROBAXIN) 500 MG tablet Take by mouth as needed.     mupirocin ointment (BACTROBAN) 2 % Apply 1 Application topically as needed.     nitroGLYCERIN (NITROSTAT) 0.4 MG SL tablet Place 0.4 mg under the tongue every 5 (five) minutes as needed for chest pain.     oxyCODONE (OXY IR/ROXICODONE) 5 MG immediate release tablet Take by mouth.     pantoprazole (PROTONIX) 40 MG tablet Take 40 mg by mouth daily.      polyethylene glycol powder (GLYCOLAX/MIRALAX) 17 GM/SCOOP powder Take by mouth.     rosuvastatin (CRESTOR) 5 MG tablet TAKE 1 TABLET BY MOUTH DAILY 90 tablet 3   trazodone (DESYREL) 300 MG tablet Take 300 mg by mouth at bedtime.  2   No current facility-administered medications on file prior to visit.    LABS/IMAGING: No results found for this or any previous visit (from the past 48 hour(s)). No results found.  LIPID PANEL: No results found for: "CHOL", "TRIG", "HDL", "CHOLHDL", "VLDL", "LDLCALC", "LDLDIRECT"  WEIGHTS: Wt  Readings from Last 3 Encounters:  08/12/23 169 lb (76.7 kg)  08/05/23 167 lb (75.8 kg)  06/06/23 161 lb 8 oz (73.3 kg)    VITALS: BP (!) 142/90   Pulse 63   Ht 5\' 5"  (1.651 m)   Wt 169 lb (76.7 kg)   SpO2 96%   BMI 28.12 kg/m   EXAM: Deferred  EKG: EKG Interpretation Date/Time:  Monday August 12 2023 13:21:31 EST Ventricular Rate:  63 PR Interval:  174 QRS Duration:  86 QT Interval:  418 QTC Calculation: 427 R Axis:   38  Text Interpretation: Normal sinus rhythm Normal ECG When compared with ECG of 02-Apr-2008 13:09, No significant change was found Confirmed by Zoila Shutter [phone]) on 08/12/2023 1:35:40 PM    ASSESSMENT: Progressive dyspnea on exertion/chest tightness, possible angina-0 coronary calcium and minimal nonobstructive coronary artery disease (07/2022-CT coronary angiography) Family history of premature coronary disease in her mother Dyslipidemia Type III Ehlers-Danlos syndrome Elevated LP(a) at 199.6 nmol/L  PLAN: 1.   Ms. Abdulaziz has responded well to [facility] therapy.  She is reporting no side effects.  Her cholesterol has substantially improved.  Her LP(a) is come down as well.  She might be a candidate for upcoming research.  She said some interest in that.  No changes to her medicines at this time.  Will continue with Leqvio and plan follow-up with me annually or sooner as necessary.  Chrystie Nose, MD, [facility], FACP  [facility]  [facility]  Medical Director of the [facility] Lipid [facility]  Cardiovascular Risk Reduction Clinic Diplomate of the American Board of Clinical Lipidology Attending Cardiologist  Direct Dial: [phone]  Fax: [phone]  Website:  [email or web address] Nicely Vickki Igou 08/12/2023, 1:35 PM

## 2023-08-12 NOTE — Patient Instructions (Signed)
Medication Instructions:  NO CHANGES  *If you need a refill on your cardiac medications before your next appointment, please call your pharmacy*   Lab Work: FASTING lab work in 12 months  If you have labs (blood work) drawn today and your tests are completely normal, you will receive your results only by: [employer] (if you have MyChart) OR A paper copy in the mail If you have any lab test that is abnormal or we need to change your treatment, we will call you to review the results.    Follow-Up: At [facility], you and your health needs are our priority.  As part of our continuing mission to provide you with exceptional heart care, we have created designated Provider Care Teams.  These Care Teams include your primary Cardiologist (physician) and Advanced Practice Providers (APPs -  Physician Assistants and Nurse Practitioners) who all work together to provide you with the care you need, when you need it.  We recommend signing up for the patient portal called "MyChart".  Sign up information is provided on this After Visit Summary.  MyChart is used to connect with patients for Virtual Visits (Telemedicine).  Patients are able to view lab/test results, encounter notes, upcoming appointments, etc.  Non-urgent messages can be sent to your provider as well.   To learn more about what you can do with MyChart, go to [email or web address].    Your next appointment:    12 months with Dr. Rennis Golden

## 2023-09-12 ENCOUNTER — Telehealth: Payer: Self-pay

## 2023-09-12 NOTE — Telephone Encounter (Signed)
  Auth Submission: NO AUTH NEEDED Payer: Medicare a/b and [employer] Medication & CPT/J Code(s) submitted: Leqvio  (Inclisiran) J1306 Route of submission (phone, fax, portal):  Phone # Fax # Auth type: Buy/Bill Units/visits requested: 284mg  x 2 doses Approval from: 08/20/22 to 10/03/24

## 2023-09-19 ENCOUNTER — Other Ambulatory Visit: Payer: Self-pay | Admitting: Orthopedic Surgery

## 2023-09-19 DIAGNOSIS — M25512 Pain in left shoulder: Secondary | ICD-10-CM

## 2023-09-19 DIAGNOSIS — Z96612 Presence of left artificial shoulder joint: Secondary | ICD-10-CM

## 2023-10-14 ENCOUNTER — Other Ambulatory Visit: Payer: Medicare Other

## 2023-10-14 ENCOUNTER — Other Ambulatory Visit: Payer: Self-pay | Admitting: Orthopedic Surgery

## 2023-10-14 ENCOUNTER — Inpatient Hospital Stay: Admission: RE | Admit: 2023-10-14 | Payer: Medicare Other | Source: Ambulatory Visit

## 2023-10-14 DIAGNOSIS — M25512 Pain in left shoulder: Secondary | ICD-10-CM

## 2023-10-15 ENCOUNTER — Ambulatory Visit
Admission: RE | Admit: 2023-10-15 | Discharge: 2023-10-15 | Disposition: A | Discharge status: Home / Self Care | Payer: Medicare Other | Source: Ambulatory Visit | Attending: Orthopedic Surgery | Admitting: Orthopedic Surgery

## 2023-10-15 ENCOUNTER — Ambulatory Visit
Admission: RE | Admit: 2023-10-15 | Discharge: 2023-10-15 | Discharge status: Home / Self Care | Payer: Medicare Other | Source: Ambulatory Visit | Attending: Orthopedic Surgery | Admitting: Orthopedic Surgery

## 2023-10-15 DIAGNOSIS — M25512 Pain in left shoulder: Secondary | ICD-10-CM

## 2023-10-15 DIAGNOSIS — Z96612 Presence of left artificial shoulder joint: Secondary | ICD-10-CM

## 2023-11-21 ENCOUNTER — Telehealth (HOSPITAL_COMMUNITY): Payer: Self-pay | Admitting: Licensed Clinical Social Worker

## 2023-11-21 NOTE — Telephone Encounter (Signed)
 The therapist returns Lucina's call confirming her identity via two identifiers. She is on 5 mg of Oxycodone three times per day saying that she has been on opioid pain medication for 20 years. She has been looking for help for which Medicare will pay.   She was diagnosed with Ehlers-Danlos Syndrome in her 24's which led to having arthritis. She had hip replacements in her forties and her fifties with numerous other hip surgeries. She had a shoulder surgery in October.   Harshitha says that she has a good pain management doctor who watches her closely. 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 Hollee says that she is a "slave to this medicine" as she cannot go anywhere and run out of the medication.  Rayley wants to know if her pain can be managed without opioid pain medications though previously has tried numerous non-opoid based interventions.  The therapist suggests that trying to taper herself off her pain medication was ill-advised as it sounds that she was cutting her dose too quickly thus precipitating a withdrawal syndrome.   By the conclusion of the call, Molley will talk to her pain management doctor about possibly having him taper her off the medication and about if it is possible to manage her pain without opioid pain medications.  She will call this therapist back again in the future on a p.r.n. basis.   Myrna Blazer, MA, LCSW, [facility], LCAS 11/21/2023

## 2023-12-05 ENCOUNTER — Ambulatory Visit (INDEPENDENT_AMBULATORY_CARE_PROVIDER_SITE_OTHER): Payer: Medicare Other

## 2023-12-05 VITALS — BP 150/83 | HR 68 | Temp 98.0°F | Resp 20 | Ht 65.0 in | Wt 175.0 lb

## 2023-12-05 DIAGNOSIS — E782 Mixed hyperlipidemia: Secondary | ICD-10-CM | POA: Diagnosis not present

## 2023-12-05 MED ORDER — INCLISIRAN SODIUM 284 MG/1.5ML ~~LOC~~ SOSY
284.0000 mg | PREFILLED_SYRINGE | Freq: Once | SUBCUTANEOUS | Status: AC
  Administered 2023-12-05: 284 mg via SUBCUTANEOUS
  Filled 2023-12-05: qty 1.5

## 2023-12-05 NOTE — Progress Notes (Signed)
 Diagnosis: Hyperlipidemia  Provider:  Chilton Greathouse MD  Procedure: Injection  Leqvio (inclisiran), Dose: 284 mg, Site: subcutaneous, Number of injections: 1  Injection Site(s): Right upper quad.abdomen  Post Care:  right upper  abdomen  Discharge: Condition: Good, Destination: Home . AVS Declined  Performed by:  Rico Ala, LPN

## 2023-12-06 NOTE — Progress Notes (Signed)
 [facility] [address] [place], [place] [address]  Pulmonary Sleep Medicine   Office Visit Note  Patient Name: [name] DOB: [birth year] MRN [id number]    Chief Complaint: Obstructive Sleep Apnea visit  Brief History:  Leane is seen today for a follow up visit for 13 cmH2O. The patient has a 4 month history of sleep apnea. Patient is using PAP nightly.  The patient feels rested after sleeping with PAP.  The patient reports benefiting from PAP use. Reported sleepiness is  improved and the Epworth Sleepiness Score is 2 out of 24. The patient will rarely take naps. The patient complains of the following: some pressure in her ears.  The compliance download shows 95% compliance with an average use time of 5 hours 31 minutes. The AHI is 1.1.  The patient does not complain of limb movements disrupting sleep. The patient continues to require PAP therapy in order to eliminate sleep apnea.   ROS  General: (-) fever, (-) chills, (-) night sweat Nose and Sinuses: (-) nasal stuffiness or itchiness, (-) postnasal drip, (-) nosebleeds, (-) sinus trouble. Mouth and Throat: (-) sore throat, (-) hoarseness. Neck: (-) swollen glands, (-) enlarged thyroid, (-) neck pain. Respiratory: - cough, - shortness of breath, - wheezing. Neurologic: - numbness, - tingling. Psychiatric: - anxiety, - depression   Current Medication: Outpatient Encounter Medications as of 12/09/2023  Medication Sig   amphetamine-dextroamphetamine (ADDERALL) 10 MG tablet Take by mouth.   albuterol (VENTOLIN HFA) 108 (90 Base) MCG/ACT inhaler Inhale into the lungs as needed.   ascorbic acid (VITAMIN C) 500 MG tablet Take by mouth.   cetirizine (ZYRTEC) 10 MG tablet Take 10 mg by mouth daily.   Cholecalciferol (VITAMIN D) 2000 UNITS tablet Take 2,000 Units by mouth 3 (three) times a week.    fluocinonide (LIDEX) 0.05 % external solution Apply topically.   gabapentin (NEURONTIN) 100 MG capsule Take 100 mg by  mouth as needed.   ibuprofen (ADVIL) 600 MG tablet Take 600 mg by mouth every 6 (six) hours as needed.   inclisiran (LEQVIO) 284 MG/1.5ML SOSY injection Inject 284 mg into the skin once. Once every 6 months   Melatonin 10 MG TABS Take 10 mg by mouth at bedtime.   methocarbamol (ROBAXIN) 500 MG tablet Take by mouth as needed.   mupirocin ointment (BACTROBAN) 2 % Apply 1 Application topically as needed.   nitroGLYCERIN (NITROSTAT) 0.4 MG SL tablet Place 0.4 mg under the tongue every 5 (five) minutes as needed for chest pain.   oxyCODONE (OXY IR/ROXICODONE) 5 MG immediate release tablet Take by mouth.   polyethylene glycol powder (GLYCOLAX/MIRALAX) 17 GM/SCOOP powder Take by mouth.   rosuvastatin (CRESTOR) 5 MG tablet TAKE 1 TABLET BY MOUTH DAILY   traZODone (DESYREL) 150 MG tablet SMARTSIG:-1 Tablet(s) By Mouth Every Night PRN   [DISCONTINUED] dextroamphetamine (DEXTROSTAT) 10 MG tablet Take 5 mg by mouth 2 (two) times daily with breakfast and lunch.    [DISCONTINUED] pantoprazole (PROTONIX) 40 MG tablet Take 40 mg by mouth daily.   [DISCONTINUED] trazodone (DESYREL) 300 MG tablet Take 300 mg by mouth at bedtime.   No facility-administered encounter medications on file as of 12/09/2023.    Surgical History: Past Surgical History:  Procedure Laterality Date   ANKLE SURGERY Right    APPENDECTOMY     JOINT REPLACEMENT     4 right hip replacements and 1 left hip replacement   MASTECTOMY Right    MENISCUS REPAIR Bilateral  NECK SURGERY     PARTIAL HYSTERECTOMY     SHOULDER SURGERY     x2    Medical History: Past Medical History:  Diagnosis Date   Chronic back pain    EDS (Ehlers-Danlos syndrome)     Family History: Non contributory to the present illness  Social History: Social History   Socioeconomic History   Marital status: Married    Spouse name: Not on file   Number of children: Not on file   Years of education: Not on file   Highest education level: Not on file   Occupational History   Not on file  Tobacco Use   Smoking status: Former    Current packs/day: 0.00    Types: Cigarettes    Quit date: 2000    Years since quitting: 25.2   Smokeless tobacco: Never  Vaping Use   Vaping status: Never Used  Substance and Sexual Activity   Alcohol use: No    Comment: rarely   Drug use: No   Sexual activity: Not on file  Other Topics Concern   Not on file  Social History Narrative   Not on file   Social Drivers of Health   Financial Resource Strain: Low Risk  (09/11/2019)   Received from [facility], [facility]   Overall Financial Resource Strain (CARDIA)    Difficulty of Paying Living Expenses: Not very hard  Food Insecurity: No Food Insecurity (10/03/2021)   Received from [facility], [facility]   Hunger Vital Sign    Worried About Running Out of Food in the Last Year: Never true    Ran Out of Food in the Last Year: Never true  Transportation Needs: No Transportation Needs (09/11/2019)   Received from [facility], [facility]   [facility] - Transportation    In the past 12 months, has lack of transportation kept you from medical appointments or from getting medications?: No    Lack of Transportation (Non-Medical): No  Physical Activity: Not on file  Stress: Not on file  Social Connections: Unknown (09/11/2019)   Received from [facility], [facility]   Social Connection and Isolation Panel [NHANES]    Frequency of Communication with Friends and Family: More than three times a week    Frequency of Social Gatherings with Friends and Family: Not on file    Attends Religious Services: Not on file    Active Member of Clubs or Organizations: No    Attends Banker Meetings: Never    Marital Status: Married  Catering manager Violence: Not on file    Vital Signs: Blood pressure 128/80, pulse 60,  resp. rate 16, height 5\' 5"  (1.651 m), weight 175 lb (79.4 kg), SpO2 97%. Body mass index is 29.12 kg/m.    Examination: General Appearance: The patient is well-developed, well-nourished, and in no distress. Neck Circumference: 39 cm Skin: Gross inspection of skin unremarkable. Head: normocephalic, no gross deformities. Eyes: no gross deformities noted. ENT: ears appear grossly normal Neurologic: Alert and oriented. No involuntary movements.  STOP BANG RISK ASSESSMENT S (snore) Have you been told that you snore?     NO   T (tired) Are you often tired, fatigued, or sleepy during the day?   NO  O (obstruction) Do you stop breathing, choke, or gasp during sleep? NO   P (pressure) Do you have or are you being treated for high blood  pressure? NO   B (BMI) Is your body index greater than 35 kg/m? NO   A (age) Are you 33 years old or older? YES   N (neck) Do you have a neck circumference greater than 16 inches?   NO   G (gender) Are you a female? NO   TOTAL STOP/BANG "YES" ANSWERS 1       A STOP-Bang score of 2 or less is considered low risk, and a score of 5 or more is high risk for having either moderate or severe OSA. For people who score 3 or 4, doctors may need to perform further assessment to determine how likely they are to have OSA.         EPWORTH SLEEPINESS SCALE:  Scale:  (0)= no chance of dozing; (1)= slight chance of dozing; (2)= moderate chance of dozing; (3)= high chance of dozing  Chance  Situtation    Sitting and reading: 0    Watching TV: 1    Sitting Inactive in public: 0    As a passenger in car: 0      Lying down to rest: 1    Sitting and talking: 0    Sitting quielty after lunch: 0    In a car, stopped in traffic: 0   TOTAL SCORE:   2 out of 24    SLEEP STUDIES:  PSG (08/2023) AHI 10/hr, REM AHI 26/hr, min SpO2 83% Titration (08/2023) CPAP@ 13 cmH2O   CPAP COMPLIANCE DATA:  Date Range: 09/19/2023-12/07/2023  Average Daily Use: 5  hours 31 minutes  Median Use: 5 hours 27 minutes  Compliance for > 4 Hours: 95%  AHI: 1.1 respiratory events per hour  Days Used: 79/80 days  Mask Leak: 3.8  95th Percentile Pressure: 13         LABS: No results found for this or any previous visit (from the past 2160 hours).  Radiology: CT SHOULDER LEFT WO CONTRAST Result Date: 10/21/2023 CLINICAL DATA:  Left shoulder pain. History of reverse arthroplasty last October. EXAM: CT OF THE UPPER LEFT EXTREMITY WITHOUT CONTRAST TECHNIQUE: Multidetector CT imaging of the upper left extremity was performed according to the standard protocol. 3-dimensional CT images were rendered by post-processing of the original CT data on an acquisition workstation. The 3-dimensional CT images were interpreted and findings were reported in the accompanying complete CT report for this study. RADIATION DOSE REDUCTION: This exam was performed according to the departmental dose-optimization program which includes automated exposure control, adjustment of the mA and/or kV according to patient size and/or use of iterative reconstruction technique. COMPARISON:  None Available. FINDINGS: Bones/Joint/Cartilage Prior left reverse total shoulder arthroplasty. No evidence of hardware failure or loosening. No fracture or dislocation. Normal acromioclavicular joint with suspected prior partial distal clavicle resection. No glenohumeral joint effusion. Small amount of fluid in the subacromial/subdeltoid bursa (series 6, image 56). Ligaments Ligaments are suboptimally evaluated by CT. Muscles and Tendons Grossly intact.  No significant rotator cuff muscle atrophy. Soft tissue No fluid collection or hematoma.  No soft tissue mass. IMPRESSION: 1. Prior left reverse total shoulder arthroplasty without evidence of hardware complication. 2. Mild subacromial/subdeltoid bursitis. Electronically Signed   By: Obie Dredge M.D.   On: 10/21/2023 14:11   CT 3D INDEPENDENT WKST Result  Date: 10/21/2023 CLINICAL DATA:  Left shoulder pain. History of reverse arthroplasty last October. EXAM: CT OF THE UPPER LEFT EXTREMITY WITHOUT CONTRAST TECHNIQUE: Multidetector CT imaging of the upper left extremity was performed according to the standard  protocol. 3-dimensional CT images were rendered by post-processing of the original CT data on an acquisition workstation. The 3-dimensional CT images were interpreted and findings were reported in the accompanying complete CT report for this study. RADIATION DOSE REDUCTION: This exam was performed according to the departmental dose-optimization program which includes automated exposure control, adjustment of the mA and/or kV according to patient size and/or use of iterative reconstruction technique. COMPARISON:  None Available. FINDINGS: Bones/Joint/Cartilage Prior left reverse total shoulder arthroplasty. No evidence of hardware failure or loosening. No fracture or dislocation. Normal acromioclavicular joint with suspected prior partial distal clavicle resection. No glenohumeral joint effusion. Small amount of fluid in the subacromial/subdeltoid bursa (series 6, image 56). Ligaments Ligaments are suboptimally evaluated by CT. Muscles and Tendons Grossly intact.  No significant rotator cuff muscle atrophy. Soft tissue No fluid collection or hematoma.  No soft tissue mass. IMPRESSION: 1. Prior left reverse total shoulder arthroplasty without evidence of hardware complication. 2. Mild subacromial/subdeltoid bursitis. Electronically Signed   By: Obie Dredge M.D.   On: 10/21/2023 14:11    No results found.  No results found.    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 OSA (obstructive sleep apnea) (Primary) The patient is struggling to tolerate PAP  as she has middle ears issues that are aggravated by her pressure, she asks for it be lowered. We will try reducing it to 11 cm with a 2 week download. She reports  benefit from PAP use. The patient was reminded how to clean equipment and advised to replace supplies routinely. The patient was also counselled on weight loss. The compliance is excellent. The AHI is 1.1.   OSA on cpap- controlled. Will try reducing pressure to 11 cm with 2 week download. Continue with excellent compliance with pap. CPAP continues to be medically necessary to treat this patient's OSA. F/u 7m  2. CPAP use counseling CPAP Counseling: had a lengthy discussion with the patient regarding the importance of PAP therapy in management of the sleep apnea. Patient appears to understand the risk factor reduction and also understands the risks associated with untreated sleep apnea. Patient will try to make a good faith effort to remain compliant with therapy. Also instructed the patient on proper cleaning of the device including the water must be changed daily if possible and use of distilled water is preferred. Patient understands that the machine should be regularly cleaned with appropriate recommended cleaning solutions that do not damage the PAP machine for example given white vinegar and water rinses. Other methods such as ozone treatment may not be as good as these simple methods to achieve cleaning.    48m General Counseling: I have discussed the findings of the evaluation and examination with Kendal Hymen.  I have also discussed any further diagnostic evaluation thatmay be needed or ordered today.  Kileigh verbalizes understanding of the findings of todays visit. We also reviewed her medications today and discussed drug interactions and side effects including but not limited excessive drowsiness and  altered mental states. We also discussed that there is always a risk not just to her but also people around her. she has been encouraged to call the office with any questions or concerns that should arise related to todays visit.  No orders of the defined types were placed in this encounter.       I have personally obtained a history, examined the patient, evaluated laboratory and imaging results, formulated the assessment and plan and placed orders. This patient was seen today by Emmaline Kluver, PA-C in collaboration with Dr. Freda Munro.   Yevonne Pax, MD [facility] Diplomate ABMS Pulmonary Critical Care Medicine and Sleep Medicine

## 2023-12-09 ENCOUNTER — Ambulatory Visit (INDEPENDENT_AMBULATORY_CARE_PROVIDER_SITE_OTHER): Admitting: Internal Medicine

## 2023-12-09 VITALS — BP 128/80 | HR 60 | Resp 16 | Ht 65.0 in | Wt 175.0 lb

## 2023-12-09 DIAGNOSIS — Z7189 Other specified counseling: Secondary | ICD-10-CM

## 2023-12-09 DIAGNOSIS — G4733 Obstructive sleep apnea (adult) (pediatric): Secondary | ICD-10-CM | POA: Diagnosis not present

## 2023-12-09 NOTE — Patient Instructions (Signed)

## 2024-02-13 ENCOUNTER — Encounter (HOSPITAL_BASED_OUTPATIENT_CLINIC_OR_DEPARTMENT_OTHER): Payer: Self-pay | Admitting: Internal Medicine

## 2024-03-05 ENCOUNTER — Encounter (HOSPITAL_COMMUNITY): Payer: Self-pay | Admitting: Family Medicine

## 2024-03-05 DIAGNOSIS — R490 Dysphonia: Secondary | ICD-10-CM

## 2024-05-06 ENCOUNTER — Other Ambulatory Visit (HOSPITAL_COMMUNITY): Payer: Self-pay | Admitting: Family Medicine

## 2024-05-06 DIAGNOSIS — Q796 Ehlers-Danlos syndrome, unspecified: Secondary | ICD-10-CM

## 2024-05-06 DIAGNOSIS — Z9289 Personal history of other medical treatment: Secondary | ICD-10-CM

## 2024-05-06 DIAGNOSIS — R9431 Abnormal electrocardiogram [ECG] [EKG]: Secondary | ICD-10-CM

## 2024-05-07 ENCOUNTER — Ambulatory Visit (HOSPITAL_COMMUNITY)
Admission: RE | Admit: 2024-05-07 | Discharge: 2024-05-07 | Disposition: A | Discharge status: Home / Self Care | Source: Ambulatory Visit | Attending: Internal Medicine | Admitting: Internal Medicine

## 2024-05-07 DIAGNOSIS — R9431 Abnormal electrocardiogram [ECG] [EKG]: Secondary | ICD-10-CM | POA: Diagnosis present

## 2024-05-07 DIAGNOSIS — Z9289 Personal history of other medical treatment: Secondary | ICD-10-CM | POA: Diagnosis present

## 2024-05-07 DIAGNOSIS — Q796 Ehlers-Danlos syndrome, unspecified: Secondary | ICD-10-CM | POA: Diagnosis present

## 2024-05-07 LAB — ECHOCARDIOGRAM COMPLETE
AR max vel: 2.34 cm2
AV Area VTI: 2.46 cm2
AV Area mean vel: 2.29 cm2
AV Mean grad: 4 mmHg
AV Peak grad: 7 mmHg
Ao pk vel: 1.32 m/s
Area-P 1/2: 2.56 cm2
S' Lateral: 3 cm

## 2024-06-05 ENCOUNTER — Ambulatory Visit

## 2024-06-05 VITALS — BP 150/84 | HR 65 | Temp 98.4°F | Resp 22 | Ht 65.0 in | Wt 179.6 lb

## 2024-06-05 DIAGNOSIS — E782 Mixed hyperlipidemia: Secondary | ICD-10-CM

## 2024-06-05 MED ORDER — INCLISIRAN SODIUM 284 MG/1.5ML ~~LOC~~ SOSY
284.0000 mg | PREFILLED_SYRINGE | Freq: Once | SUBCUTANEOUS | Status: AC
  Administered 2024-06-05: 284 mg via SUBCUTANEOUS
  Filled 2024-06-05: qty 1.5

## 2024-06-05 NOTE — Progress Notes (Unsigned)
 [facility] [address] [place], [place] [address]  Pulmonary Sleep Medicine   Office Visit Note  Patient Name: [name] DOB: 04-14-1956 MRN [id number]    Chief Complaint: Obstructive Sleep Apnea visit  Brief History:  Kassidee is seen today for a 6 month follow up visit for CPAP@ 11 cmH2O. The patient has a 6 month history of sleep apnea. Patient is using PAP nightly.  The patient feels rested after sleeping with PAP.  The patient reports benefiting from PAP use. Reported sleepiness is  improved and the Epworth Sleepiness Score is 4 out of 24. The patient does not take naps. The patient complains of the following: none.  The compliance download shows 81% compliance with an average use time of 5 hours 9 minutes. The AHI is 1.4.  The patient does complain of limb movements disrupting sleep. The patient continues to require PAP therapy in order to eliminate sleep apnea.   ROS  General: (-) fever, (-) chills, (-) night sweat Nose and Sinuses: (-) nasal stuffiness or itchiness, (-) postnasal drip, (-) nosebleeds, (-) sinus trouble. Mouth and Throat: (-) sore throat, (-) hoarseness. Neck: (-) swollen glands, (-) enlarged thyroid, (-) neck pain. Respiratory: - cough, - shortness of breath, - wheezing. Neurologic: - numbness, - tingling. Psychiatric: - anxiety, - depression   Current Medication: Outpatient Encounter Medications as of 06/08/2024  Medication Sig   albuterol (VENTOLIN HFA) 108 (90 Base) MCG/ACT inhaler Inhale into the lungs as needed.   amphetamine-dextroamphetamine (ADDERALL) 10 MG tablet Take by mouth.   ascorbic acid (VITAMIN C) 500 MG tablet Take by mouth.   cetirizine (ZYRTEC) 10 MG tablet Take 10 mg by mouth daily.   Cholecalciferol (VITAMIN D) 2000 UNITS tablet Take 2,000 Units by mouth 3 (three) times a week.    fluocinonide (LIDEX) 0.05 % external solution Apply topically.   gabapentin (NEURONTIN) 100 MG capsule Take 100 mg by mouth as  needed.   ibuprofen (ADVIL) 600 MG tablet Take 600 mg by mouth every 6 (six) hours as needed.   inclisiran (LEQVIO ) 284 MG/1.5ML SOSY injection Inject 284 mg into the skin once. Once every 6 months   Melatonin 10 MG TABS Take 10 mg by mouth at bedtime.   methocarbamol (ROBAXIN) 500 MG tablet Take by mouth as needed.   mupirocin ointment (BACTROBAN) 2 % Apply 1 Application topically as needed.   nitroGLYCERIN  (NITROSTAT ) 0.4 MG SL tablet Place 0.4 mg under the tongue every 5 (five) minutes as needed for chest pain.   oxyCODONE (OXY IR/ROXICODONE) 5 MG immediate release tablet Take by mouth.   polyethylene glycol powder (GLYCOLAX/MIRALAX) 17 GM/SCOOP powder Take by mouth.   rosuvastatin  (CRESTOR ) 5 MG tablet TAKE 1 TABLET BY MOUTH DAILY   traZODone (DESYREL) 150 MG tablet SMARTSIG:-1 Tablet(s) By Mouth Every Night PRN   No facility-administered encounter medications on file as of 06/08/2024.    Surgical History: Past Surgical History:  Procedure Laterality Date   ANKLE SURGERY Right    APPENDECTOMY     JOINT REPLACEMENT     4 right hip replacements and 1 left hip replacement   MASTECTOMY Right    MENISCUS REPAIR Bilateral    NECK SURGERY     PARTIAL HYSTERECTOMY     SHOULDER SURGERY     x2    Medical History: Past Medical History:  Diagnosis Date   Chronic back pain    EDS (Ehlers-Danlos syndrome)     Family History: Non contributory to the present  illness  Social History: Social History   Socioeconomic History   Marital status: Married    Spouse name: Not on file   Number of children: Not on file   Years of education: Not on file   Highest education level: Not on file  Occupational History   Not on file  Tobacco Use   Smoking status: Former    Current packs/day: 0.00    Types: Cigarettes    Quit date: 2000    Years since quitting: 25.7   Smokeless tobacco: Never  Vaping Use   Vaping status: Never Used  Substance and Sexual Activity   Alcohol use: No     Comment: rarely   Drug use: No   Sexual activity: Not on file  Other Topics Concern   Not on file  Social History Narrative   Not on file   Social Drivers of Health   Financial Resource Strain: Medium Risk (01/06/2024)   Received from [facility]   Overall Financial Resource Strain (CARDIA)    Difficulty of Paying Living Expenses: Somewhat hard  Food Insecurity: No Food Insecurity (01/06/2024)   Received from [facility]   Hunger Vital Sign    Within the past 12 months, you worried that your food would run out before you got the money to buy more.: Never true    Within the past 12 months, the food you bought just didn't last and you didn't have money to get more.: Never true  Transportation Needs: No Transportation Needs (01/06/2024)   Received from [facility] - Transportation    In the past 12 months, has lack of transportation kept you from medical appointments or from getting medications?: No    Lack of Transportation (Non-Medical): No  Physical Activity: Not on file  Stress: Not on file  Social Connections: Unknown (09/11/2019)   Received from [facility]   Social Connection and Isolation Panel    In a typical week, how many times do you talk on the phone with family, friends, or neighbors?: More than three times a week    Frequency of Social Gatherings with Friends and Family: Not on file    Attends Religious Services: Not on file    Do you belong to any clubs or organizations such as church groups, unions, fraternal or athletic groups, or school groups?: No    How often do you attend meetings of the clubs or organizations you belong to?: Never    Are you married, widowed, divorced, separated, never married, or living with a partner?: Married  Intimate Partner Violence: Not on file    Vital Signs: There were no vitals taken for this visit. There is no height or weight on file to calculate BMI.     Examination: General Appearance: The patient is well-developed, well-nourished, and in no distress. Neck Circumference: 39 cm Skin: Gross inspection of skin unremarkable. Head: normocephalic, no gross deformities. Eyes: no gross deformities noted. ENT: ears appear grossly normal Neurologic: Alert and oriented. No involuntary movements.  STOP BANG RISK ASSESSMENT S (snore) Have you been told that you snore?     NO   T (tired) Are you often tired, fatigued, or sleepy during the day?   NO  O (obstruction) Do you stop breathing, choke, or gasp during sleep? NO   P (pressure) Do you have or are you being treated for high blood pressure? NO   B (BMI) Is your body index  greater than 35 kg/m? NO   A (age) Are you 70 years old or older? YES   N (neck) Do you have a neck circumference greater than 16 inches?   NO   G (gender) Are you a female? NO   TOTAL STOP/BANG "YES" ANSWERS 1       A STOP-Bang score of 2 or less is considered low risk, and a score of 5 or more is high risk for having either moderate or severe OSA. For people who score 3 or 4, doctors may need to perform further assessment to determine how likely they are to have OSA.         EPWORTH SLEEPINESS SCALE:  Scale:  (0)= no chance of dozing; (1)= slight chance of dozing; (2)= moderate chance of dozing; (3)= high chance of dozing  Chance  Situtation    Sitting and reading: 1    Watching TV: 1    Sitting Inactive in public: 0    As a passenger in car: 0      Lying down to rest: 2    Sitting and talking: 0    Sitting quielty after lunch: 0    In a car, stopped in traffic: 0   TOTAL SCORE:   4 out of 24    SLEEP STUDIES:  PSG (08/2023) AHI 10/hr, REM AHI 26/hr, min Sp02 83% Titration (08/2023) CPAP @ 13 cmH2O   CPAP COMPLIANCE DATA:  Date Range: 12/08/2023-06/04/2024  Average Daily Use: 5 hours 9 minutes  Median Use: 4 hours 39 minutes  Compliance for > 4 Hours: 81%  AHI: 1.4 respiratory  events per hour  Days Used: 173/180 days  Mask Leak: 3.1  95th Percentile Pressure: 11         LABS: Recent Results (from the past 2160 hours)  ECHOCARDIOGRAM COMPLETE     Status: None   Collection Time: 05/07/24  1:36 PM  Result Value Ref Range   S' Lateral 3.00 cm   AV Area VTI 2.46 cm2   AV Mean grad 4.0 mmHg   AV Area mean vel 2.29 cm2   Area-P 1/2 2.56 cm2   AR max vel 2.34 cm2   AV Peak grad 7.0 mmHg   Ao pk vel 1.32 m/s   Est EF 60 - 65%     Radiology: ECHOCARDIOGRAM COMPLETE Result Date: 05/07/2024    ECHOCARDIOGRAM REPORT   Patient Name:   AALEEYAH BIAS Date of Exam: 05/07/2024 Medical Rec #:  [id number]        Height:       65.0 in Accession #:    [id number]       Weight:       175.0 lb Date of Birth:  01/07/1956        BSA:          1.869 m Patient Age:    68 years         BP:           125/79 mmHg Patient Gender: F                HR:           57 bpm. Exam Location:  [address] Procedure: 2D Echo, Cardiac Doppler, Color Doppler and Strain Analysis (Both            Spectral and Color Flow Doppler were utilized during procedure). Indications:    Abnormal Echocardiogram R943.1  History:  Patient has prior history of Echocardiogram examinations.                 Signs/Symptoms:Shortness of Breath; Risk Factors:Former Smoker.                 Ehlers-Danlos syndrome.  Sonographer:    Cherene Ravens RDCS Referring Phys: [id number] IKE LAVENDER IMPRESSIONS  1. Left ventricular ejection fraction, by estimation, is 60 to 65%. The left ventricle has normal function. The left ventricle has no regional wall motion abnormalities. Left ventricular diastolic parameters are consistent with Grade I diastolic dysfunction (impaired relaxation).  2. Right ventricular systolic function is normal. The right ventricular size is normal.  3. The mitral valve was not well visualized. Trivial mitral valve regurgitation. No evidence of mitral stenosis.  4. The aortic valve is tricuspid. There is mild  calcification of the aortic valve. Aortic valve regurgitation is not visualized. Aortic valve sclerosis is present, with no evidence of aortic valve stenosis. Comparison(s): No significant change from prior study. Prior images reviewed side by side. FINDINGS  Left Ventricle: Left ventricular ejection fraction, by estimation, is 60 to 65%. The left ventricle has normal function. The left ventricle has no regional wall motion abnormalities. Strain was performed and the global longitudinal strain is indeterminate. The global longitudinal strain is normal despite suboptimal segment tracking. The left ventricular internal cavity size was normal in size. There is no left ventricular hypertrophy. Left ventricular diastolic parameters are consistent with  Grade I diastolic dysfunction (impaired relaxation). Right Ventricle: The right ventricular size is normal. No increase in right ventricular wall thickness. Right ventricular systolic function is normal. Left Atrium: Left atrial size was normal in size. Right Atrium: Right atrial size was not well visualized. Pericardium: There is no evidence of pericardial effusion. Presence of epicardial fat layer. Mitral Valve: The mitral valve was not well visualized. Trivial mitral valve regurgitation. No evidence of mitral valve stenosis. Tricuspid Valve: The tricuspid valve is normal in structure. Tricuspid valve regurgitation is trivial. No evidence of tricuspid stenosis. Aortic Valve: The aortic valve is tricuspid. There is mild calcification of the aortic valve. Aortic valve regurgitation is not visualized. Aortic valve sclerosis is present, with no evidence of aortic valve stenosis. Aortic valve mean gradient measures 4.0 mmHg. Aortic valve peak gradient measures 7.0 mmHg. Aortic valve area, by VTI measures 2.46 cm. Pulmonic Valve: The pulmonic valve was normal in structure. Pulmonic valve regurgitation is not visualized. No evidence of pulmonic stenosis. Aorta: The aortic root  and ascending aorta are structurally normal, with no evidence of dilitation. IAS/Shunts: No atrial level shunt detected by color flow Doppler.  LEFT VENTRICLE PLAX 2D LVIDd:         4.70 cm   Diastology LVIDs:         3.00 cm   LV e' medial:    6.09 cm/s LV PW:         1.30 cm   LV E/e' medial:  8.1 LV IVS:        1.10 cm   LV e' lateral:   6.42 cm/s LVOT diam:     2.00 cm   LV E/e' lateral: 7.7 LV SV:         72 LV SV Index:   38        2D Longitudinal Strain LVOT Area:     3.14 cm  2D Strain GLS (A4C):   -15.1 %  2D Strain GLS (A3C):   -17.6 %                          2D Strain GLS (A2C):   -14.0 % RIGHT VENTRICLE RV S prime:     12.00 cm/s TAPSE (M-mode): 2.0 cm RVSP:           21.7 mmHg LEFT ATRIUM             Index        RIGHT ATRIUM LA diam:        4.10 cm 2.19 cm/m   RA Pressure: 3.00 mmHg LA Vol (A2C):   57.1 ml 30.55 ml/m LA Vol (A4C):   42.9 ml 22.95 ml/m LA Biplane Vol: 53.3 ml 28.52 ml/m  AORTIC VALVE AV Area (Vmax):    2.34 cm AV Area (Vmean):   2.29 cm AV Area (VTI):     2.46 cm AV Vmax:           132.00 cm/s AV Vmean:          87.100 cm/s AV VTI:            0.293 m AV Peak Grad:      7.0 mmHg AV Mean Grad:      4.0 mmHg LVOT Vmax:         98.50 cm/s LVOT Vmean:        63.400 cm/s LVOT VTI:          0.229 m LVOT/AV VTI ratio: 0.78  AORTA Ao Root diam: 2.90 cm Ao Asc diam:  2.90 cm MITRAL VALVE               TRICUSPID VALVE MV Area (PHT): 2.56 cm    TR Peak grad:   18.7 mmHg MV Decel Time: 296 msec    TR Vmax:        216.00 cm/s MV E velocity: 49.30 cm/s  Estimated RAP:  3.00 mmHg MV A velocity: 76.50 cm/s  RVSP:           21.7 mmHg MV E/A ratio:  0.64                            SHUNTS                            Systemic VTI:  0.23 m                            Systemic Diam: 2.00 cm Stanly Leavens MD Electronically signed by Stanly Leavens MD Signature Date/Time: 05/07/2024/3:57:26 PM    Final     No results found.  ECHOCARDIOGRAM COMPLETE Result Date:  05/07/2024    ECHOCARDIOGRAM REPORT   Patient Name:   SONJI STARKES Date of Exam: 05/07/2024 Medical Rec #:  [id number]        Height:       65.0 in Accession #:    [id number]       Weight:       175.0 lb Date of Birth:  1956/01/14        BSA:          1.869 m Patient Age:    68 years         BP:           125/79 mmHg  Patient Gender: F                HR:           57 bpm. Exam Location:  [address] Procedure: 2D Echo, Cardiac Doppler, Color Doppler and Strain Analysis (Both            Spectral and Color Flow Doppler were utilized during procedure). Indications:    Abnormal Echocardiogram R943.1  History:        Patient has prior history of Echocardiogram examinations.                 Signs/Symptoms:Shortness of Breath; Risk Factors:Former Smoker.                 Ehlers-Danlos syndrome.  Sonographer:    Cherene Ravens RDCS Referring Phys: [id number] IKE LAVENDER IMPRESSIONS  1. Left ventricular ejection fraction, by estimation, is 60 to 65%. The left ventricle has normal function. The left ventricle has no regional wall motion abnormalities. Left ventricular diastolic parameters are consistent with Grade I diastolic dysfunction (impaired relaxation).  2. Right ventricular systolic function is normal. The right ventricular size is normal.  3. The mitral valve was not well visualized. Trivial mitral valve regurgitation. No evidence of mitral stenosis.  4. The aortic valve is tricuspid. There is mild calcification of the aortic valve. Aortic valve regurgitation is not visualized. Aortic valve sclerosis is present, with no evidence of aortic valve stenosis. Comparison(s): No significant change from prior study. Prior images reviewed side by side. FINDINGS  Left Ventricle: Left ventricular ejection fraction, by estimation, is 60 to 65%. The left ventricle has normal function. The left ventricle has no regional wall motion abnormalities. Strain was performed and the global longitudinal strain is indeterminate. The global  longitudinal strain is normal despite suboptimal segment tracking. The left ventricular internal cavity size was normal in size. There is no left ventricular hypertrophy. Left ventricular diastolic parameters are consistent with  Grade I diastolic dysfunction (impaired relaxation). Right Ventricle: The right ventricular size is normal. No increase in right ventricular wall thickness. Right ventricular systolic function is normal. Left Atrium: Left atrial size was normal in size. Right Atrium: Right atrial size was not well visualized. Pericardium: There is no evidence of pericardial effusion. Presence of epicardial fat layer. Mitral Valve: The mitral valve was not well visualized. Trivial mitral valve regurgitation. No evidence of mitral valve stenosis. Tricuspid Valve: The tricuspid valve is normal in structure. Tricuspid valve regurgitation is trivial. No evidence of tricuspid stenosis. Aortic Valve: The aortic valve is tricuspid. There is mild calcification of the aortic valve. Aortic valve regurgitation is not visualized. Aortic valve sclerosis is present, with no evidence of aortic valve stenosis. Aortic valve mean gradient measures 4.0 mmHg. Aortic valve peak gradient measures 7.0 mmHg. Aortic valve area, by VTI measures 2.46 cm. Pulmonic Valve: The pulmonic valve was normal in structure. Pulmonic valve regurgitation is not visualized. No evidence of pulmonic stenosis. Aorta: The aortic root and ascending aorta are structurally normal, with no evidence of dilitation. IAS/Shunts: No atrial level shunt detected by color flow Doppler.  LEFT VENTRICLE PLAX 2D LVIDd:         4.70 cm   Diastology LVIDs:         3.00 cm   LV e' medial:    6.09 cm/s LV PW:         1.30 cm   LV E/e' medial:  8.1 LV IVS:  1.10 cm   LV e' lateral:   6.42 cm/s LVOT diam:     2.00 cm   LV E/e' lateral: 7.7 LV SV:         72 LV SV Index:   38        2D Longitudinal Strain LVOT Area:     3.14 cm  2D Strain GLS (A4C):   -15.1 %                           2D Strain GLS (A3C):   -17.6 %                          2D Strain GLS (A2C):   -14.0 % RIGHT VENTRICLE RV S prime:     12.00 cm/s TAPSE (M-mode): 2.0 cm RVSP:           21.7 mmHg LEFT ATRIUM             Index        RIGHT ATRIUM LA diam:        4.10 cm 2.19 cm/m   RA Pressure: 3.00 mmHg LA Vol (A2C):   57.1 ml 30.55 ml/m LA Vol (A4C):   42.9 ml 22.95 ml/m LA Biplane Vol: 53.3 ml 28.52 ml/m  AORTIC VALVE AV Area (Vmax):    2.34 cm AV Area (Vmean):   2.29 cm AV Area (VTI):     2.46 cm AV Vmax:           132.00 cm/s AV Vmean:          87.100 cm/s AV VTI:            0.293 m AV Peak Grad:      7.0 mmHg AV Mean Grad:      4.0 mmHg LVOT Vmax:         98.50 cm/s LVOT Vmean:        63.400 cm/s LVOT VTI:          0.229 m LVOT/AV VTI ratio: 0.78  AORTA Ao Root diam: 2.90 cm Ao Asc diam:  2.90 cm MITRAL VALVE               TRICUSPID VALVE MV Area (PHT): 2.56 cm    TR Peak grad:   18.7 mmHg MV Decel Time: 296 msec    TR Vmax:        216.00 cm/s MV E velocity: 49.30 cm/s  Estimated RAP:  3.00 mmHg MV A velocity: 76.50 cm/s  RVSP:           21.7 mmHg MV E/A ratio:  0.64                            SHUNTS                            Systemic VTI:  0.23 m                            Systemic Diam: 2.00 cm Stanly Leavens MD Electronically signed by Stanly Leavens MD Signature Date/Time: 05/07/2024/3:57:26 PM    Final       Assessment and Plan: Patient Active Problem List   Diagnosis Date Noted   Snoring 08/05/2023   Bilateral primary osteoarthritis of hip 12/29/2018  Asthma 09/03/2018   Prediabetes 09/03/2018   ADD (attention deficit disorder) 01/13/2012   Anxiety state 04/05/2011   Elevated blood-pressure reading without diagnosis of hypertension 04/05/2011   History of ductal carcinoma in situ (DCIS) of breast 04/05/2011   Mixed hyperlipidemia 01/11/2009   TOURETTE'S DISORDER 01/11/2009   ALLERGIC RHINITIS 01/11/2009   EHLERS-DANLOS SYNDROME 01/11/2009   DYSPNEA 01/11/2009     1. OSA (obstructive sleep apnea) (Primary) The patient does tolerate PAP and reports  benefit from PAP use. The patient was reminded how to clean equipment and advised to replace supplies routinely. The patient was also counselled on weight loss. The compliance is good. The AHI is 1.4.   OSA on cpap- controlled. Continue with compliance with pap. CPAP continues to be medically necessary to treat this patient's OSA. F/u one year.    2. CPAP use counseling CPAP Counseling: had a lengthy discussion with the patient regarding the importance of PAP therapy in management of the sleep apnea. Patient appears to understand the risk factor reduction and also understands the risks associated with untreated sleep apnea. Patient will try to make a good faith effort to remain compliant with therapy. Also instructed the patient on proper cleaning of the device including the water must be changed daily if possible and use of distilled water is preferred. Patient understands that the machine should be regularly cleaned with appropriate recommended cleaning solutions that do not damage the PAP machine for example given white vinegar and water rinses. Other methods such as ozone treatment may not be as good as these simple methods to achieve cleaning.     General Counseling: I have discussed the findings of the evaluation and examination with Consuelo.  I have also discussed any further diagnostic evaluation thatmay be needed or ordered today. Sheronica verbalizes understanding of the findings of todays visit. We also reviewed her medications today and discussed drug interactions and side effects including but not limited excessive drowsiness and altered mental states. We also discussed that there is always a risk not just to her but also people around her. she has been encouraged to call the office with any questions or concerns that should arise related to todays visit.  No orders of the defined types were placed in  this encounter.       I have personally obtained a history, examined the patient, evaluated laboratory and imaging results, formulated the assessment and plan and placed orders. This patient was seen today by Lauraine Lay, PA-C in collaboration with Dr. Elfreda Bathe.   Elfreda DELENA Bathe, MD [facility] Diplomate ABMS Pulmonary Critical Care Medicine and Sleep Medicine

## 2024-06-05 NOTE — Progress Notes (Signed)
 Diagnosis: Hyperlipidemia  Provider:  Mannam, Praveen MD  Procedure: Injection  Leqvio  (inclisiran), Dose: 284 mg, Site: subcutaneous, Number of injections: 1  Injection Site(s): Left lower quad. abdomen  Post Care: Patient declined observation  Discharge: Condition: Good, Destination: Home . AVS Declined  Performed by:  Star East, LPN

## 2024-06-08 ENCOUNTER — Ambulatory Visit (INDEPENDENT_AMBULATORY_CARE_PROVIDER_SITE_OTHER): Admitting: Internal Medicine

## 2024-06-08 VITALS — BP 138/81 | HR 68 | Resp 16 | Ht 65.0 in | Wt 180.0 lb

## 2024-06-08 DIAGNOSIS — G4733 Obstructive sleep apnea (adult) (pediatric): Secondary | ICD-10-CM | POA: Diagnosis not present

## 2024-06-08 DIAGNOSIS — Z7189 Other specified counseling: Secondary | ICD-10-CM | POA: Diagnosis not present

## 2024-06-08 NOTE — Patient Instructions (Signed)

## 2024-06-26 ENCOUNTER — Other Ambulatory Visit: Payer: Self-pay | Admitting: *Deleted

## 2024-06-26 DIAGNOSIS — E7841 Elevated Lipoprotein(a): Secondary | ICD-10-CM

## 2024-06-26 DIAGNOSIS — E785 Hyperlipidemia, unspecified: Secondary | ICD-10-CM

## 2024-08-04 LAB — NMR, LIPOPROFILE
Cholesterol, Total: 144 mg/dL (ref 100–199)
HDL Particle Number: 49.3 umol/L (ref >=30.5)
HDL-C: 75 mg/dL (ref >39)
LDL Particle Number: 575 nmol/L (ref <1000)
LDL Size: 20.9 nm (ref >20.5)
LDL-C (NIH Calc): 51 mg/dL (ref 0–99)
LP-IR Score: 43 (ref <=45)
Small LDL Particle Number: 354 nmol/L (ref <=527)
Triglycerides: 99 mg/dL (ref 0–149)

## 2024-08-10 ENCOUNTER — Ambulatory Visit: Payer: Self-pay | Admitting: Internal Medicine

## 2024-08-14 ENCOUNTER — Ambulatory Visit: Attending: Internal Medicine | Admitting: Internal Medicine

## 2024-09-10 ENCOUNTER — Telehealth: Payer: Self-pay

## 2024-09-10 NOTE — Telephone Encounter (Signed)
 Auth Submission: NO AUTH NEEDED Site of care: Site of care: [facility] Payer: Medicare A/B with AARP supplement Medication & CPT/J Code(s) submitted: Leqvio  (Inclisiran) J1306 Diagnosis Code:  Route of submission (phone, fax, portal):  Phone # Fax # Auth type: Buy/Bill PB Units/visits requested: 284mg  x 2 doses Reference number:  Approval from: 09/10/24 to 10/03/25

## 2024-11-26 ENCOUNTER — Ambulatory Visit: Admitting: Internal Medicine

## 2024-12-04 ENCOUNTER — Ambulatory Visit
